# Patient Record
Sex: Female | Born: 1965 | Race: Black or African American | Hispanic: No | Marital: Single | Smoking: Never smoker
Health system: Southern US, Community
[De-identification: ages and names within clinical notes are randomized; demographics above are authoritative.]

## PROBLEM LIST (undated history)

## (undated) DIAGNOSIS — I10 Essential (primary) hypertension: Secondary | ICD-10-CM

## (undated) HISTORY — PX: TUBAL LIGATION: SHX77

## (undated) HISTORY — DX: Essential (primary) hypertension: I10

---

## 2003-12-04 ENCOUNTER — Emergency Department: Payer: Self-pay | Admitting: Emergency Medicine

## 2007-06-13 ENCOUNTER — Emergency Department: Payer: Self-pay | Admitting: Emergency Medicine

## 2017-09-04 ENCOUNTER — Ambulatory Visit (INDEPENDENT_AMBULATORY_CARE_PROVIDER_SITE_OTHER): Payer: BLUE CROSS/BLUE SHIELD | Admitting: Family Medicine

## 2017-09-04 ENCOUNTER — Encounter: Payer: Self-pay | Admitting: Family Medicine

## 2017-09-04 VITALS — BP 159/84 | HR 89 | Temp 98.5°F | Ht 64.5 in | Wt 195.8 lb

## 2017-09-04 DIAGNOSIS — Z7689 Persons encountering health services in other specified circumstances: Secondary | ICD-10-CM

## 2017-09-04 DIAGNOSIS — I10 Essential (primary) hypertension: Secondary | ICD-10-CM | POA: Diagnosis not present

## 2017-09-04 MED ORDER — AMLODIPINE BESYLATE 5 MG PO TABS: 5.0000 mg | ORAL_TABLET | Freq: Every day | ORAL | 0 refills | Status: DC

## 2017-09-04 NOTE — Progress Notes (Signed)
   BP (!) 159/84 (BP Location: Right Arm, Patient Position: Sitting, Cuff Size: Normal)   Pulse 89   Temp 98.5 F (36.9 C) (Oral)   Ht 5' 4.5" (1.638 m)   Wt 195 lb 12.8 oz (88.8 kg)   SpO2 97%   BMI 33.09 kg/m    Subjective:    Patient ID: Nicole Cortez, female    DOB: [birth year], 52 y.o.   MRN: [id number]  HPI: Nicole Cortez is a 52 y.o. female  Chief Complaint  Patient presents with  . Establish Care    No complaints   Here today to establish care. No concerns today. No known medical problems and not on any medications. Last CPE was 17 years ago when she had her son.   Has had some BP elevations in the past but never diagnosed with HTN. Does have a strong fhx of it. Denies CP, SOB, syncope, HAs, visual disturbances.  Relevant past medical, surgical, family and social history reviewed and updated as indicated. Interim medical history since our last visit reviewed. Allergies and medications reviewed and updated.  Review of Systems  Per HPI unless specifically indicated above     Objective:    BP (!) 159/84 (BP Location: Right Arm, Patient Position: Sitting, Cuff Size: Normal)   Pulse 89   Temp 98.5 F (36.9 C) (Oral)   Ht 5' 4.5" (1.638 m)   Wt 195 lb 12.8 oz (88.8 kg)   SpO2 97%   BMI 33.09 kg/m   Wt Readings from Last 3 Encounters:  09/04/17 195 lb 12.8 oz (88.8 kg)    Physical Exam  Constitutional: She is oriented to person, place, and time. She appears well-developed and well-nourished.  HENT:  Head: Atraumatic.  Eyes: Conjunctivae and EOM are normal.  Neck: Normal range of motion. Neck supple.  Cardiovascular: Normal rate and regular rhythm.  Pulmonary/Chest: Effort normal and breath sounds normal.  Musculoskeletal: Normal range of motion.  Neurological: She is alert and oriented to person, place, and time.  Skin: Skin is warm and dry.  Psychiatric: She has a normal mood and affect. Her behavior is normal.  Nursing note and vitals reviewed.   No  results found for this or any previous visit.    Assessment & Plan:   Problem List Items Addressed This Visit      Cardiovascular and Mediastinum   Essential hypertension - Primary    Start 5 mg amlodipine, DASH diet, increase exercise. Recheck in 1 month. Risks and benefits reviewed      Relevant Medications   amLODipine (NORVASC) 5 MG tablet    Other Visit Diagnoses    Encounter to establish care           Follow up plan: Return in about 4 weeks (around 10/02/2017) for CPE,BP f/u.

## 2017-09-07 NOTE — Patient Instructions (Signed)
Follow up for CPE 

## 2017-09-07 NOTE — Assessment & Plan Note (Signed)
Start 5 mg amlodipine, DASH diet, increase exercise. Recheck in 1 month. Risks and benefits reviewed

## 2017-12-19 ENCOUNTER — Telehealth: Payer: Self-pay | Admitting: Family Medicine

## 2017-12-19 ENCOUNTER — Other Ambulatory Visit: Payer: Self-pay | Admitting: Family Medicine

## 2017-12-19 ENCOUNTER — Encounter: Payer: Self-pay | Admitting: Family Medicine

## 2017-12-19 ENCOUNTER — Ambulatory Visit (INDEPENDENT_AMBULATORY_CARE_PROVIDER_SITE_OTHER): Payer: BLUE CROSS/BLUE SHIELD | Admitting: Family Medicine

## 2017-12-19 ENCOUNTER — Other Ambulatory Visit (HOSPITAL_COMMUNITY)
Admission: RE | Admit: 2017-12-19 | Discharge: 2017-12-19 | Disposition: A | Discharge status: Home / Self Care | Payer: BLUE CROSS/BLUE SHIELD | Source: Ambulatory Visit | Attending: Family Medicine | Admitting: Family Medicine

## 2017-12-19 VITALS — BP 170/108 | HR 81 | Temp 98.2°F | Ht 64.5 in | Wt 198.1 lb

## 2017-12-19 DIAGNOSIS — I10 Essential (primary) hypertension: Secondary | ICD-10-CM

## 2017-12-19 DIAGNOSIS — Z124 Encounter for screening for malignant neoplasm of cervix: Secondary | ICD-10-CM | POA: Insufficient documentation

## 2017-12-19 DIAGNOSIS — Z1211 Encounter for screening for malignant neoplasm of colon: Secondary | ICD-10-CM

## 2017-12-19 DIAGNOSIS — Z1239 Encounter for other screening for malignant neoplasm of breast: Secondary | ICD-10-CM

## 2017-12-19 DIAGNOSIS — Z0000 Encounter for general adult medical examination without abnormal findings: Secondary | ICD-10-CM

## 2017-12-19 DIAGNOSIS — B9689 Other specified bacterial agents as the cause of diseases classified elsewhere: Secondary | ICD-10-CM

## 2017-12-19 DIAGNOSIS — N760 Acute vaginitis: Secondary | ICD-10-CM

## 2017-12-19 DIAGNOSIS — N898 Other specified noninflammatory disorders of vagina: Secondary | ICD-10-CM | POA: Diagnosis not present

## 2017-12-19 LAB — UA/M W/RFLX CULTURE, ROUTINE
Bilirubin, UA: NEGATIVE
Glucose, UA: NEGATIVE
Ketones, UA: NEGATIVE
LEUKOCYTES UA: NEGATIVE
Nitrite, UA: NEGATIVE
Protein, UA: NEGATIVE
RBC UA: NEGATIVE
SPEC GRAV UA: 1.02 (ref 1.005–1.030)
Urobilinogen, Ur: 0.2 mg/dL (ref 0.2–1.0)
pH, UA: 6 (ref 5.0–7.5)

## 2017-12-19 LAB — WET PREP FOR TRICH, YEAST, CLUE
Clue Cell Exam: POSITIVE — AB
Trichomonas Exam: NEGATIVE
Yeast Exam: NEGATIVE

## 2017-12-19 MED ORDER — METRONIDAZOLE 500 MG PO TABS: 500.0000 mg | ORAL_TABLET | Freq: Two times a day (BID) | ORAL | 0 refills | Status: DC

## 2017-12-19 MED ORDER — AMLODIPINE BESYLATE 5 MG PO TABS: 5.0000 mg | ORAL_TABLET | Freq: Every day | ORAL | 0 refills | Status: DC

## 2017-12-19 NOTE — Telephone Encounter (Signed)
Nicole Cortez presented in office, wants to know if she can have prescription for amlodipine sent to pharmacy. Filled in august but wasn't ready to take it. Please advise

## 2017-12-19 NOTE — Telephone Encounter (Signed)
Sent!

## 2017-12-19 NOTE — Progress Notes (Signed)
BP (!) 170/108   Pulse 81   Temp 98.2 F (36.8 C) (Oral)   Ht 5' 4.5" (1.638 m)   Wt 198 lb 1.6 oz (89.9 kg)   LMP 11/16/2017 (Exact Date)   SpO2 98%   BMI 33.48 kg/m    Subjective:    Patient ID: Nicole Cortez, female    DOB: [birth year], 52 y.o.   MRN: [id number]  HPI: Nicole Cortez is a 52 y.o. female presenting on 12/19/2017 for comprehensive medical examination. Current medical complaints include:see below  Was given amlodipine at est care visit for persistently elevated BPs but did not start the medicine as her friends told her she shouldn't. States she is open to if she absolutely has to. Does not check her BPs outside of clinic. Denies CP, SOB, HAs, dizziness. Does not follow any particular diet or exercise regularly. Has never been on BP medicines before. Has a strong fhx of HTN.   Having vaginal discharge and mild lower abdominal cramping the past few weeks. No dysuria, hematuria, itching, concern for STIs.   She currently lives with: Menopausal Symptoms: no  Depression Screen done today and results listed below:  [facility] 12/19/2017  Decreased Interest 0  Down, Depressed, Hopeless 0  PHQ - 2 Score 0  Altered sleeping 0  Tired, decreased energy 1  Change in appetite 1  Feeling bad or failure about yourself  0  Trouble concentrating 0  Moving slowly or fidgety/restless 0  Suicidal thoughts 0  PHQ-9 Score 2  Difficult doing work/chores Not difficult at all    The patient does not have a history of falls. I did not complete a risk assessment for falls. A plan of care for falls was not documented.   Past Medical History:  History reviewed. No pertinent past medical history.  Surgical History:  Past Surgical History:  Procedure Laterality Date  . TUBAL LIGATION      Medications:  No current outpatient medications on file prior to visit.   No current facility-administered medications on file prior to visit.     Allergies:  No Known  Allergies  Social History:  Social History   Socioeconomic History  . Marital status: Single    Spouse name: Not on file  . Number of children: Not on file  . Years of education: Not on file  . Highest education level: Not on file  Occupational History  . Not on file  Social Needs  . Financial resource strain: Not on file  . Food insecurity:    Worry: Not on file    Inability: Not on file  . Transportation needs:    Medical: Not on file    Non-medical: Not on file  Tobacco Use  . Smoking status: Never Smoker  . Smokeless tobacco: Never Used  Substance and Sexual Activity  . Alcohol use: Yes    Alcohol/week: 3.0 standard drinks    Types: 3 Cans of beer per week    Comment: On Weekends  . Drug use: Never  . Sexual activity: Yes  Lifestyle  . Physical activity:    Days per week: Not on file    Minutes per session: Not on file  . Stress: Not on file  Relationships  . Social connections:    Talks on phone: Not on file    Gets together: Not on file    Attends religious service: Not on file    Active member of club or organization: Not on  file    Attends meetings of clubs or organizations: Not on file    Relationship status: Not on file  . Intimate partner violence:    Fear of current or ex partner: Not on file    Emotionally abused: Not on file    Physically abused: Not on file    Forced sexual activity: Not on file  Other Topics Concern  . Not on file  Social History Narrative  . Not on file   Social History   Tobacco Use  Smoking Status Never Smoker  Smokeless Tobacco Never Used   Social History   Substance and Sexual Activity  Alcohol Use Yes  . Alcohol/week: 3.0 standard drinks  . Types: 3 Cans of beer per week   Comment: On Weekends    Family History:  Family History  Problem Relation Age of Onset  . Hypertension Mother   . Kidney disease Mother   . Hypertension Maternal Aunt   . Kidney disease Maternal Aunt   . Hypertension Maternal Uncle     . Kidney disease Maternal Uncle     Past medical history, surgical history, medications, allergies, family history and social history reviewed with patient today and changes made to appropriate areas of the chart.   Review of Systems - General ROS: negative Psychological ROS: negative Ophthalmic ROS: negative ENT ROS: negative Allergy and Immunology ROS: negative Hematological and Lymphatic ROS: negative Endocrine ROS: negative Breast ROS: negative for breast lumps Respiratory ROS: no cough, shortness of breath, or wheezing Cardiovascular ROS: no chest pain or dyspnea on exertion Gastrointestinal ROS: no abdominal pain, change in bowel habits, or black or bloody stools Genito-Urinary ROS: positive for - genital discharge Musculoskeletal ROS: negative Neurological ROS: no TIA or stroke symptoms Dermatological ROS: negative All other ROS negative except what is listed above and in the HPI.      Objective:    BP (!) 170/108   Pulse 81   Temp 98.2 F (36.8 C) (Oral)   Ht 5' 4.5" (1.638 m)   Wt 198 lb 1.6 oz (89.9 kg)   LMP 11/16/2017 (Exact Date)   SpO2 98%   BMI 33.48 kg/m   Wt Readings from Last 3 Encounters:  12/19/17 198 lb 1.6 oz (89.9 kg)  09/04/17 195 lb 12.8 oz (88.8 kg)    Physical Exam  Constitutional: She is oriented to person, place, and time. She appears well-developed and well-nourished. No distress.  HENT:  Head: Atraumatic.  Right Ear: External ear normal.  Left Ear: External ear normal.  Nose: Nose normal.  Mouth/Throat: Oropharynx is clear and moist. No oropharyngeal exudate.  Eyes: Pupils are equal, round, and reactive to light. Conjunctivae are normal. No scleral icterus.  Neck: Normal range of motion. Neck supple. No thyromegaly present.  Cardiovascular: Normal rate, regular rhythm, normal heart sounds and intact distal pulses.  Pulmonary/Chest: Effort normal and breath sounds normal. No respiratory distress. Right breast exhibits no mass, no  nipple discharge, no skin change and no tenderness. Left breast exhibits no mass, no nipple discharge, no skin change and no tenderness.  Abdominal: Soft. Bowel sounds are normal. She exhibits no mass. There is no tenderness.  Musculoskeletal: Normal range of motion. She exhibits no edema or tenderness.  Lymphadenopathy:    She has no cervical adenopathy.    She has no axillary adenopathy.  Neurological: She is alert and oriented to person, place, and time. No cranial nerve deficit.  Skin: Skin is warm and dry. No rash noted.  Psychiatric: She has a normal mood and affect. Her behavior is normal.  Nursing note and vitals reviewed.   Results for orders placed or performed in visit on 12/19/17  WET PREP FOR TRICH, YEAST, CLUE  Result Value Ref Range   Trichomonas Exam Negative Negative   Yeast Exam Negative Negative   Clue Cell Exam Positive (A) Negative  CBC with Differential/Platelet  Result Value Ref Range   WBC 5.1 3.4 - 10.8 x10E3/uL   RBC 3.66 (L) 3.77 - 5.28 x10E6/uL   Hemoglobin 10.1 (L) 11.1 - 15.9 g/dL   Hematocrit 16.1 (L) 09.6 - 46.6 %   MCV 85 79 - 97 fL   MCH 27.6 26.6 - 33.0 pg   MCHC 32.5 31.5 - 35.7 g/dL   RDW 04.5 (H) 40.9 - 81.1 %   Platelets 341 150 - 450 x10E3/uL   Neutrophils 42 Not Estab. %   Lymphs 40 Not Estab. %   Monocytes 13 Not Estab. %   Eos 4 Not Estab. %   Basos 1 Not Estab. %   Neutrophils Absolute 2.1 1.4 - 7.0 x10E3/uL   Lymphocytes Absolute 2.1 0.7 - 3.1 x10E3/uL   Monocytes Absolute 0.7 0.1 - 0.9 x10E3/uL   EOS (ABSOLUTE) 0.2 0.0 - 0.4 x10E3/uL   Basophils Absolute 0.0 0.0 - 0.2 x10E3/uL   Immature Granulocytes 0 Not Estab. %   Immature Grans (Abs) 0.0 0.0 - 0.1 x10E3/uL  Comprehensive metabolic panel  Result Value Ref Range   Glucose 95 65 - 99 mg/dL   BUN 15 6 - 24 mg/dL   Creatinine, Ser 9.14 (H) 0.57 - 1.00 mg/dL   GFR calc non Af Amer 63 >59 mL/min/1.73   GFR calc Af Amer 73 >59 mL/min/1.73   BUN/Creatinine Ratio 15 9 - 23    Sodium 136 134 - 144 mmol/L   Potassium 4.3 3.5 - 5.2 mmol/L   Chloride 101 96 - 106 mmol/L   CO2 22 20 - 29 mmol/L   Calcium 9.5 8.7 - 10.2 mg/dL   Total Protein 7.3 6.0 - 8.5 g/dL   Albumin 4.7 3.5 - 5.5 g/dL   Globulin, Total 2.6 1.5 - 4.5 g/dL   Albumin/Globulin Ratio 1.8 1.2 - 2.2   Bilirubin Total 0.2 0.0 - 1.2 mg/dL   Alkaline Phosphatase 38 (L) 39 - 117 IU/L   AST 19 0 - 40 IU/L   ALT 16 0 - 32 IU/L  Lipid Panel w/o Chol/HDL Ratio  Result Value Ref Range   Cholesterol, Total 227 (H) 100 - 199 mg/dL   Triglycerides 96 0 - 149 mg/dL   HDL 84 >78 mg/dL   VLDL Cholesterol Cal 19 5 - 40 mg/dL   LDL Calculated 295 (H) 0 - 99 mg/dL  TSH  Result Value Ref Range   TSH 3.240 0.450 - 4.500 uIU/mL  UA/M w/rflx Culture, Routine  Result Value Ref Range   Specific Gravity, UA 1.020 1.005 - 1.030   pH, UA 6.0 5.0 - 7.5   Color, UA Yellow Yellow   Appearance Ur Clear Clear   Leukocytes, UA Negative Negative   Protein, UA Negative Negative/Trace   Glucose, UA Negative Negative   Ketones, UA Negative Negative   RBC, UA Negative Negative   Bilirubin, UA Negative Negative   Urobilinogen, Ur 0.2 0.2 - 1.0 mg/dL   Nitrite, UA Negative Negative  Cytology - PAP  Result Value Ref Range   Adequacy      Satisfactory for evaluation  endocervical/transformation zone  component PRESENT.   Diagnosis      NEGATIVE FOR INTRAEPITHELIAL LESIONS OR MALIGNANCY.   HPV NOT DETECTED    Material Submitted CervicoVaginal Pap [ThinPrep Imaged]    CYTOLOGY - PAP PAP RESULT       Assessment & Plan:   Problem List Items Addressed This Visit      Cardiovascular and Mediastinum   Essential hypertension - Primary    BPs continue to be elevated. Recommended she start the amlodipine and follow up in 1 month for recheck. Script for home monitor given, requested she keep a log of readings. DASH diet, exercise       Other Visit Diagnoses    Annual physical exam       Relevant Orders   CBC with  Differential/Platelet (Completed)   Comprehensive metabolic panel (Completed)   Lipid Panel w/o Chol/HDL Ratio (Completed)   TSH (Completed)   UA/M w/rflx Culture, Routine (Completed)   BV (bacterial vaginosis)       Wet prep + for BV. Tx with flagyl, probiotics. Vaginal hygiene reviewed   Relevant Orders   WET PREP FOR TRICH, YEAST, CLUE (Completed)   Screening for breast cancer       Relevant Orders   MM DIGITAL SCREENING BILATERAL   Screening for colon cancer       Relevant Orders   Cologuard   Screening for cervical cancer       Relevant Orders   Cytology - PAP (Completed)       Follow up plan: Return in about 4 weeks (around 01/16/2018) for BP f/u.   LABORATORY TESTING:  - Pap smear: pap done  IMMUNIZATIONS:   - Tdap: Tetanus vaccination status reviewed: last tetanus booster within 10 years. - Influenza: Refused  SCREENING: -Mammogram: Ordered today  - Colonoscopy: Ordered today   PATIENT COUNSELING:   Advised to take 1 mg of folate supplement per day if capable of pregnancy.   Sexuality: Discussed sexually transmitted diseases, partner selection, use of condoms, avoidance of unintended pregnancy  and contraceptive alternatives.   Advised to avoid cigarette smoking.  I discussed with the patient that most people either abstain from alcohol or drink within safe limits (<=14/week and <=4 drinks/occasion for males, <=7/weeks and <= 3 drinks/occasion for females) and that the risk for alcohol disorders and other health effects rises proportionally with the number of drinks per week and how often a drinker exceeds daily limits.  Discussed cessation/primary prevention of drug use and availability of treatment for abuse.   Diet: Encouraged to adjust caloric intake to maintain  or achieve ideal body weight, to reduce intake of dietary saturated fat and total fat, to limit sodium intake by avoiding high sodium foods and not adding table salt, and to maintain adequate dietary  potassium and calcium preferably from fresh fruits, vegetables, and low-fat dairy products.    stressed the importance of regular exercise  Injury prevention: Discussed safety belts, safety helmets, smoke detector, smoking near bedding or upholstery.   Dental health: Discussed importance of regular tooth brushing, flossing, and dental visits.    NEXT PREVENTATIVE PHYSICAL DUE IN 1 YEAR. Return in about 4 weeks (around 01/16/2018) for BP f/u.

## 2017-12-19 NOTE — Telephone Encounter (Signed)
Copied from CRM [phone]. Topic: Quick Communication - See Telephone Encounter >> Dec 19, 2017  4:58 PM Jens SomMedley, Jennifer A wrote: CRM for notification. See Telephone encounter for: 12/19/17.  Patient is calling to understand  why the prescribtion for metroNIDAZOLE (FLAGYL) 500 MG tablet [[id number]][[id number]] was called in for her. Please advise

## 2017-12-20 LAB — COMPREHENSIVE METABOLIC PANEL
ALBUMIN: 4.7 g/dL (ref 3.5–5.5)
ALK PHOS: 38 IU/L — AB (ref 39–117)
ALT: 16 IU/L (ref 0–32)
AST: 19 IU/L (ref 0–40)
Albumin/Globulin Ratio: 1.8 (ref 1.2–2.2)
BUN / CREAT RATIO: 15 (ref 9–23)
BUN: 15 mg/dL (ref 6–24)
Bilirubin Total: 0.2 mg/dL (ref 0.0–1.2)
CO2: 22 mmol/L (ref 20–29)
CREATININE: 1.01 mg/dL — AB (ref 0.57–1.00)
Calcium: 9.5 mg/dL (ref 8.7–10.2)
Chloride: 101 mmol/L (ref 96–106)
GFR calc Af Amer: 73 mL/min/{1.73_m2} (ref >59)
GFR calc non Af Amer: 63 mL/min/{1.73_m2} (ref >59)
GLOBULIN, TOTAL: 2.6 g/dL (ref 1.5–4.5)
Glucose: 95 mg/dL (ref 65–99)
Potassium: 4.3 mmol/L (ref 3.5–5.2)
SODIUM: 136 mmol/L (ref 134–144)
Total Protein: 7.3 g/dL (ref 6.0–8.5)

## 2017-12-20 LAB — CBC WITH DIFFERENTIAL/PLATELET
BASOS: 1 %
Basophils Absolute: 0 10*3/uL (ref 0.0–0.2)
EOS (ABSOLUTE): 0.2 10*3/uL (ref 0.0–0.4)
Eos: 4 %
HEMATOCRIT: 31.1 % — AB (ref 34.0–46.6)
HEMOGLOBIN: 10.1 g/dL — AB (ref 11.1–15.9)
Immature Grans (Abs): 0 10*3/uL (ref 0.0–0.1)
Immature Granulocytes: 0 %
LYMPHS ABS: 2.1 10*3/uL (ref 0.7–3.1)
Lymphs: 40 %
MCH: 27.6 pg (ref 26.6–33.0)
MCHC: 32.5 g/dL (ref 31.5–35.7)
MCV: 85 fL (ref 79–97)
MONOCYTES: 13 %
Monocytes Absolute: 0.7 10*3/uL (ref 0.1–0.9)
Neutrophils Absolute: 2.1 10*3/uL (ref 1.4–7.0)
Neutrophils: 42 %
Platelets: 341 10*3/uL (ref 150–450)
RBC: 3.66 x10E6/uL — ABNORMAL LOW (ref 3.77–5.28)
RDW: 15.5 % — ABNORMAL HIGH (ref 12.3–15.4)
WBC: 5.1 10*3/uL (ref 3.4–10.8)

## 2017-12-20 LAB — LIPID PANEL W/O CHOL/HDL RATIO
CHOLESTEROL TOTAL: 227 mg/dL — AB (ref 100–199)
HDL: 84 mg/dL (ref >39)
LDL Calculated: 124 mg/dL — ABNORMAL HIGH (ref 0–99)
TRIGLYCERIDES: 96 mg/dL (ref 0–149)
VLDL CHOLESTEROL CAL: 19 mg/dL (ref 5–40)

## 2017-12-20 LAB — TSH: TSH: 3.24 u[IU]/mL (ref 0.450–4.500)

## 2017-12-22 ENCOUNTER — Telehealth: Payer: Self-pay | Admitting: Family Medicine

## 2017-12-22 LAB — CYTOLOGY - PAP
DIAGNOSIS: NEGATIVE
HPV (WINDOPATH): NOT DETECTED

## 2017-12-22 NOTE — Telephone Encounter (Signed)
Patient was notified that the medicine was being called in for BV on Friday - please call and make sure she's aware of this

## 2017-12-22 NOTE — Telephone Encounter (Signed)
Patient notified

## 2017-12-22 NOTE — Telephone Encounter (Signed)
Result note sent from lab results tab

## 2017-12-22 NOTE — Telephone Encounter (Signed)
Routing to provider  

## 2017-12-22 NOTE — Telephone Encounter (Signed)
Routing to provider for results.  

## 2017-12-22 NOTE — Telephone Encounter (Signed)
Copied from CRM [phone]. Topic: Quick Communication - See Telephone Encounter >> Dec 22, 2017  2:11 PM Terisa Starraylor, Brittany L wrote: CRM for notification. See Telephone encounter for: 12/22/17.  Patient is calling for her lab results. Please contact patient.

## 2017-12-23 NOTE — Assessment & Plan Note (Signed)
BPs continue to be elevated. Recommended she start the amlodipine and follow up in 1 month for recheck. Script for home monitor given, requested she keep a log of readings. DASH diet, exercise

## 2018-01-16 ENCOUNTER — Ambulatory Visit (INDEPENDENT_AMBULATORY_CARE_PROVIDER_SITE_OTHER): Payer: BLUE CROSS/BLUE SHIELD | Admitting: Family Medicine

## 2018-01-16 ENCOUNTER — Encounter: Payer: Self-pay | Admitting: Family Medicine

## 2018-01-16 VITALS — BP 147/92 | HR 71 | Temp 98.3°F | Wt 193.6 lb

## 2018-01-16 DIAGNOSIS — I10 Essential (primary) hypertension: Secondary | ICD-10-CM

## 2018-01-16 MED ORDER — AMLODIPINE BESYLATE 10 MG PO TABS: 10.0000 mg | ORAL_TABLET | Freq: Every day | ORAL | 0 refills | Status: DC

## 2018-01-16 NOTE — Progress Notes (Signed)
BP (!) 147/92 (BP Location: Left Arm, Cuff Size: Normal)   Pulse 71   Temp 98.3 F (36.8 C) (Oral)   Wt 193 lb 9.6 oz (87.8 kg)   SpO2 98%   BMI 32.72 kg/m    Subjective:    Patient ID: Nicole Cortez, female    DOB: [birth year], 53 y.o.   MRN: [id number]  HPI: Nicole Cortez is a 53 y.o. female  Chief Complaint  Patient presents with  . Hypertension   Here today for 1 month HTN follow up. Started on 5 mg amlodipine which she has been taking faithfully without side effects. Started checking home BPs which have been 140s/90s. Has cut out sodas and lost 5-10 lb. Trying to exercise as much as able. Denies CP, SOB, HAs, dizziness, syncope.   Relevant past medical, surgical, family and social history reviewed and updated as indicated. Interim medical history since our last visit reviewed. Allergies and medications reviewed and updated.  Review of Systems  Per HPI unless specifically indicated above     Objective:    BP (!) 147/92 (BP Location: Left Arm, Cuff Size: Normal)   Pulse 71   Temp 98.3 F (36.8 C) (Oral)   Wt 193 lb 9.6 oz (87.8 kg)   SpO2 98%   BMI 32.72 kg/m   Wt Readings from Last 3 Encounters:  01/16/18 193 lb 9.6 oz (87.8 kg)  12/19/17 198 lb 1.6 oz (89.9 kg)  09/04/17 195 lb 12.8 oz (88.8 kg)    Physical Exam Vitals signs and nursing note reviewed.  Constitutional:      Appearance: Normal appearance. She is not ill-appearing.  HENT:     Head: Atraumatic.  Eyes:     Extraocular Movements: Extraocular movements intact.     Conjunctiva/sclera: Conjunctivae normal.  Neck:     Musculoskeletal: Normal range of motion and neck supple.  Cardiovascular:     Rate and Rhythm: Normal rate and regular rhythm.     Heart sounds: Normal heart sounds.  Pulmonary:     Effort: Pulmonary effort is normal.     Breath sounds: Normal breath sounds.  Musculoskeletal: Normal range of motion.  Skin:    General: Skin is warm and dry.  Neurological:     Mental Status:  She is alert and oriented to person, place, and time.  Psychiatric:        Mood and Affect: Mood normal.        Thought Content: Thought content normal.        Judgment: Judgment normal.     Results for orders placed or performed in visit on 12/19/17  WET PREP FOR TRICH, YEAST, CLUE  Result Value Ref Range   Trichomonas Exam Negative Negative   Yeast Exam Negative Negative   Clue Cell Exam Positive (A) Negative  CBC with Differential/Platelet  Result Value Ref Range   WBC 5.1 3.4 - 10.8 x10E3/uL   RBC 3.66 (L) 3.77 - 5.28 x10E6/uL   Hemoglobin 10.1 (L) 11.1 - 15.9 g/dL   Hematocrit 41.6 (L) 60.6 - 46.6 %   MCV 85 79 - 97 fL   MCH 27.6 26.6 - 33.0 pg   MCHC 32.5 31.5 - 35.7 g/dL   RDW 30.1 (H) 60.1 - 09.3 %   Platelets 341 150 - 450 x10E3/uL   Neutrophils 42 Not Estab. %   Lymphs 40 Not Estab. %   Monocytes 13 Not Estab. %   Eos 4 Not Estab. %   Basos  1 Not Estab. %   Neutrophils Absolute 2.1 1.4 - 7.0 x10E3/uL   Lymphocytes Absolute 2.1 0.7 - 3.1 x10E3/uL   Monocytes Absolute 0.7 0.1 - 0.9 x10E3/uL   EOS (ABSOLUTE) 0.2 0.0 - 0.4 x10E3/uL   Basophils Absolute 0.0 0.0 - 0.2 x10E3/uL   Immature Granulocytes 0 Not Estab. %   Immature Grans (Abs) 0.0 0.0 - 0.1 x10E3/uL  Comprehensive metabolic panel  Result Value Ref Range   Glucose 95 65 - 99 mg/dL   BUN 15 6 - 24 mg/dL   Creatinine, Ser 2.22 (H) 0.57 - 1.00 mg/dL   GFR calc non Af Amer 63 >59 mL/min/1.73   GFR calc Af Amer 73 >59 mL/min/1.73   BUN/Creatinine Ratio 15 9 - 23   Sodium 136 134 - 144 mmol/L   Potassium 4.3 3.5 - 5.2 mmol/L   Chloride 101 96 - 106 mmol/L   CO2 22 20 - 29 mmol/L   Calcium 9.5 8.7 - 10.2 mg/dL   Total Protein 7.3 6.0 - 8.5 g/dL   Albumin 4.7 3.5 - 5.5 g/dL   Globulin, Total 2.6 1.5 - 4.5 g/dL   Albumin/Globulin Ratio 1.8 1.2 - 2.2   Bilirubin Total 0.2 0.0 - 1.2 mg/dL   Alkaline Phosphatase 38 (L) 39 - 117 IU/L   AST 19 0 - 40 IU/L   ALT 16 0 - 32 IU/L  Lipid Panel w/o Chol/HDL Ratio    Result Value Ref Range   Cholesterol, Total 227 (H) 100 - 199 mg/dL   Triglycerides 96 0 - 149 mg/dL   HDL 84 >97 mg/dL   VLDL Cholesterol Cal 19 5 - 40 mg/dL   LDL Calculated 989 (H) 0 - 99 mg/dL  TSH  Result Value Ref Range   TSH 3.240 0.450 - 4.500 uIU/mL  UA/M w/rflx Culture, Routine  Result Value Ref Range   Specific Gravity, UA 1.020 1.005 - 1.030   pH, UA 6.0 5.0 - 7.5   Color, UA Yellow Yellow   Appearance Ur Clear Clear   Leukocytes, UA Negative Negative   Protein, UA Negative Negative/Trace   Glucose, UA Negative Negative   Ketones, UA Negative Negative   RBC, UA Negative Negative   Bilirubin, UA Negative Negative   Urobilinogen, Ur 0.2 0.2 - 1.0 mg/dL   Nitrite, UA Negative Negative  Cytology - PAP  Result Value Ref Range   Adequacy      Satisfactory for evaluation  endocervical/transformation zone component PRESENT.   Diagnosis      NEGATIVE FOR INTRAEPITHELIAL LESIONS OR MALIGNANCY.   HPV NOT DETECTED    Material Submitted CervicoVaginal Pap [ThinPrep Imaged]    CYTOLOGY - PAP PAP RESULT       Assessment & Plan:   Problem List Items Addressed This Visit      Cardiovascular and Mediastinum   Essential hypertension - Primary    Increase to 10 mg amlodipine and continue good lifestyle modifications. Follow up in 1 month. Continue logging home readings.        Relevant Medications   amLODipine (NORVASC) 10 MG tablet       Follow up plan: Return in about 4 weeks (around 02/13/2018) for BP.

## 2018-01-16 NOTE — Patient Instructions (Signed)
[facility] - [phone]    

## 2018-01-17 NOTE — Assessment & Plan Note (Signed)
Increase to 10 mg amlodipine and continue good lifestyle modifications. Follow up in 1 month. Continue logging home readings.

## 2018-01-28 ENCOUNTER — Telehealth: Payer: Self-pay

## 2018-01-28 NOTE — Telephone Encounter (Signed)
Letter mailed to patient.

## 2018-02-11 ENCOUNTER — Ambulatory Visit (INDEPENDENT_AMBULATORY_CARE_PROVIDER_SITE_OTHER): Payer: BLUE CROSS/BLUE SHIELD | Admitting: Family Medicine

## 2018-02-11 ENCOUNTER — Encounter: Payer: Self-pay | Admitting: Family Medicine

## 2018-02-11 VITALS — BP 148/82 | HR 58 | Temp 98.0°F | Ht 64.5 in | Wt 194.0 lb

## 2018-02-11 DIAGNOSIS — I10 Essential (primary) hypertension: Secondary | ICD-10-CM

## 2018-02-11 MED ORDER — AMLODIPINE BESY-BENAZEPRIL HCL 10-20 MG PO CAPS: 1.0000 | ORAL_CAPSULE | Freq: Every day | ORAL | 0 refills | Status: DC

## 2018-02-11 NOTE — Progress Notes (Signed)
BP (!) 148/82   Pulse (!) 58   Temp 98 F (36.7 C) (Oral)   Ht 5' 4.5" (1.638 m)   Wt 194 lb (88 kg)   SpO2 97%   BMI 32.79 kg/m    Subjective:    Patient ID: Nicole Cortez, female    DOB: 10-23-1965, 53 y.o.   MRN: [id number]  HPI: Nicole Cortez is a 53 y.o. female  Chief Complaint  Patient presents with  . Follow-up  . Hypertension   Here today for HTN follow up after increasing amlodipine to 10 mg. Taking faithfully without side effects but not noticing much difference with her readings. Checking at work as she's able and getting 150s/90s pretty consistently. Denies CP, SOB, HAs, dizziness, syncope. Has been working hard on diet modifications and has not had a soda in almost 2 months. Down 5 lb in just over a month.   Relevant past medical, surgical, family and social history reviewed and updated as indicated. Interim medical history since our last visit reviewed. Allergies and medications reviewed and updated.  Review of Systems  Per HPI unless specifically indicated above     Objective:    BP (!) 148/82   Pulse (!) 58   Temp 98 F (36.7 C) (Oral)   Ht 5' 4.5" (1.638 m)   Wt 194 lb (88 kg)   SpO2 97%   BMI 32.79 kg/m   Wt Readings from Last 3 Encounters:  02/11/18 194 lb (88 kg)  01/16/18 193 lb 9.6 oz (87.8 kg)  12/19/17 198 lb 1.6 oz (89.9 kg)    Physical Exam Vitals signs and nursing note reviewed.  Constitutional:      Appearance: Normal appearance. She is not ill-appearing.  HENT:     Head: Atraumatic.  Eyes:     Extraocular Movements: Extraocular movements intact.     Conjunctiva/sclera: Conjunctivae normal.  Neck:     Musculoskeletal: Normal range of motion and neck supple.  Cardiovascular:     Rate and Rhythm: Normal rate and regular rhythm.     Heart sounds: Normal heart sounds.  Pulmonary:     Effort: Pulmonary effort is normal.     Breath sounds: Normal breath sounds.  Musculoskeletal: Normal range of motion.  Skin:    General: Skin  is warm and dry.  Neurological:     Mental Status: She is alert and oriented to person, place, and time.  Psychiatric:        Mood and Affect: Mood normal.        Thought Content: Thought content normal.        Judgment: Judgment normal.     Results for orders placed or performed in visit on 12/19/17  WET PREP FOR TRICH, YEAST, CLUE  Result Value Ref Range   Trichomonas Exam Negative Negative   Yeast Exam Negative Negative   Clue Cell Exam Positive (A) Negative  CBC with Differential/Platelet  Result Value Ref Range   WBC 5.1 3.4 - 10.8 x10E3/uL   RBC 3.66 (L) 3.77 - 5.28 x10E6/uL   Hemoglobin 10.1 (L) 11.1 - 15.9 g/dL   Hematocrit 32.4 (L) 40.1 - 46.6 %   MCV 85 79 - 97 fL   MCH 27.6 26.6 - 33.0 pg   MCHC 32.5 31.5 - 35.7 g/dL   RDW 02.7 (H) 25.3 - 66.4 %   Platelets 341 150 - 450 x10E3/uL   Neutrophils 42 Not Estab. %   Lymphs 40 Not Estab. %  Monocytes 13 Not Estab. %   Eos 4 Not Estab. %   Basos 1 Not Estab. %   Neutrophils Absolute 2.1 1.4 - 7.0 x10E3/uL   Lymphocytes Absolute 2.1 0.7 - 3.1 x10E3/uL   Monocytes Absolute 0.7 0.1 - 0.9 x10E3/uL   EOS (ABSOLUTE) 0.2 0.0 - 0.4 x10E3/uL   Basophils Absolute 0.0 0.0 - 0.2 x10E3/uL   Immature Granulocytes 0 Not Estab. %   Immature Grans (Abs) 0.0 0.0 - 0.1 x10E3/uL  Comprehensive metabolic panel  Result Value Ref Range   Glucose 95 65 - 99 mg/dL   BUN 15 6 - 24 mg/dL   Creatinine, Ser 0.981.01 (H) 0.57 - 1.00 mg/dL   GFR calc non Af Amer 63 >59 mL/min/1.73   GFR calc Af Amer 73 >59 mL/min/1.73   BUN/Creatinine Ratio 15 9 - 23   Sodium 136 134 - 144 mmol/L   Potassium 4.3 3.5 - 5.2 mmol/L   Chloride 101 96 - 106 mmol/L   CO2 22 20 - 29 mmol/L   Calcium 9.5 8.7 - 10.2 mg/dL   Total Protein 7.3 6.0 - 8.5 g/dL   Albumin 4.7 3.5 - 5.5 g/dL   Globulin, Total 2.6 1.5 - 4.5 g/dL   Albumin/Globulin Ratio 1.8 1.2 - 2.2   Bilirubin Total 0.2 0.0 - 1.2 mg/dL   Alkaline Phosphatase 38 (L) 39 - 117 IU/L   AST 19 0 - 40 IU/L    ALT 16 0 - 32 IU/L  Lipid Panel w/o Chol/HDL Ratio  Result Value Ref Range   Cholesterol, Total 227 (H) 100 - 199 mg/dL   Triglycerides 96 0 - 149 mg/dL   HDL 84 >11>39 mg/dL   VLDL Cholesterol Cal 19 5 - 40 mg/dL   LDL Calculated 914124 (H) 0 - 99 mg/dL  TSH  Result Value Ref Range   TSH 3.240 0.450 - 4.500 uIU/mL  UA/M w/rflx Culture, Routine  Result Value Ref Range   Specific Gravity, UA 1.020 1.005 - 1.030   pH, UA 6.0 5.0 - 7.5   Color, UA Yellow Yellow   Appearance Ur Clear Clear   Leukocytes, UA Negative Negative   Protein, UA Negative Negative/Trace   Glucose, UA Negative Negative   Ketones, UA Negative Negative   RBC, UA Negative Negative   Bilirubin, UA Negative Negative   Urobilinogen, Ur 0.2 0.2 - 1.0 mg/dL   Nitrite, UA Negative Negative  Cytology - PAP  Result Value Ref Range   Adequacy      Satisfactory for evaluation  endocervical/transformation zone component PRESENT.   Diagnosis      NEGATIVE FOR INTRAEPITHELIAL LESIONS OR MALIGNANCY.   HPV NOT DETECTED    Material Submitted CervicoVaginal Pap [ThinPrep Imaged]    CYTOLOGY - PAP PAP RESULT       Assessment & Plan:   Problem List Items Addressed This Visit      Cardiovascular and Mediastinum   Essential hypertension - Primary    Start amlodipine benazepril to help achieve better BP control. Continue monitoring at work, call with persistent abnormal readings or side effects      Relevant Medications   amLODipine-benazepril (LOTREL) 10-20 MG capsule       Follow up plan: Return in about 4 weeks (around 03/11/2018) for BP.

## 2018-02-13 NOTE — Assessment & Plan Note (Signed)
Start amlodipine benazepril to help achieve better BP control. Continue monitoring at work, call with persistent abnormal readings or side effects

## 2018-03-12 ENCOUNTER — Other Ambulatory Visit: Payer: Self-pay | Admitting: Family Medicine

## 2018-03-12 NOTE — Telephone Encounter (Signed)
Requested Prescriptions  Pending Prescriptions Disp Refills  . amLODipine (NORVASC) 5 MG tablet [Pharmacy Med Name: AMLODIPINE BESYLATE 5 MG TAB] 90 tablet 0    Sig: TAKE 1 TABLET BY MOUTH EVERY DAY     Cardiovascular:  Calcium Channel Blockers Failed - 03/12/2018  1:23 AM      Failed - Last BP in normal range    BP Readings from Last 1 Encounters:  02/11/18 (!) 148/82         Passed - Valid encounter within last 6 months    Recent Outpatient Visits          4 weeks ago Essential hypertension   [facility] [place], Salley Hews, [place]   1 month ago Essential hypertension   [facility] [place], [place]   2 months ago Essential hypertension   [facility] [place], [place]   6 months ago Essential hypertension   [facility] [place], Salley Hews, [place]      Future Appointments            Tomorrow Maurice March, [place]ws, PA-C [facility], [facility]

## 2018-03-13 ENCOUNTER — Encounter: Payer: Self-pay | Admitting: Family Medicine

## 2018-03-13 ENCOUNTER — Ambulatory Visit (INDEPENDENT_AMBULATORY_CARE_PROVIDER_SITE_OTHER): Payer: BLUE CROSS/BLUE SHIELD | Admitting: Family Medicine

## 2018-03-13 VITALS — BP 134/82 | HR 91 | Temp 98.1°F | Wt 193.8 lb

## 2018-03-13 DIAGNOSIS — I10 Essential (primary) hypertension: Secondary | ICD-10-CM | POA: Diagnosis not present

## 2018-03-13 MED ORDER — AMLODIPINE BESY-BENAZEPRIL HCL 10-20 MG PO CAPS: 1.0000 | ORAL_CAPSULE | Freq: Every day | ORAL | 1 refills | Status: DC

## 2018-03-13 NOTE — Assessment & Plan Note (Signed)
Stable and WNL, continue current regimen 

## 2018-03-13 NOTE — Progress Notes (Signed)
BP 134/82 (BP Location: Left Arm, Cuff Size: Normal)   Pulse 91   Temp 98.1 F (36.7 C) (Oral)   Wt 193 lb 12.8 oz (87.9 kg)   SpO2 96%   BMI 32.75 kg/m    Subjective:    Patient ID: Nicole Cortez, female    DOB: 02-13-1965, 53 y.o.   MRN: [id number]  HPI: Nicole Cortez is a 53 y.o. female  Chief Complaint  Patient presents with  . Hypertension    4 week f/up   Here today for 1 month BP f/u. Currently on amlodipine - benazepril, taking daily without side effects. Denies CP, SOB, HAs, dizziness. Checking BPs at work occasionally and getting 120s-130s/80s. Has continued doing very well with diet and increasing activity.   Relevant past medical, surgical, family and social history reviewed and updated as indicated. Interim medical history since our last visit reviewed. Allergies and medications reviewed and updated.  Review of Systems  Per HPI unless specifically indicated above     Objective:    BP 134/82 (BP Location: Left Arm, Cuff Size: Normal)   Pulse 91   Temp 98.1 F (36.7 C) (Oral)   Wt 193 lb 12.8 oz (87.9 kg)   SpO2 96%   BMI 32.75 kg/m   Wt Readings from Last 3 Encounters:  03/13/18 193 lb 12.8 oz (87.9 kg)  02/11/18 194 lb (88 kg)  01/16/18 193 lb 9.6 oz (87.8 kg)    Physical Exam Vitals signs and nursing note reviewed.  Constitutional:      Appearance: Normal appearance. She is not ill-appearing.  HENT:     Head: Atraumatic.  Eyes:     Extraocular Movements: Extraocular movements intact.     Conjunctiva/sclera: Conjunctivae normal.  Neck:     Musculoskeletal: Normal range of motion and neck supple.  Cardiovascular:     Rate and Rhythm: Normal rate and regular rhythm.     Heart sounds: Normal heart sounds.  Pulmonary:     Effort: Pulmonary effort is normal.     Breath sounds: Normal breath sounds.  Musculoskeletal: Normal range of motion.  Skin:    General: Skin is warm and dry.  Neurological:     Mental Status: She is alert and oriented  to person, place, and time.  Psychiatric:        Mood and Affect: Mood normal.        Thought Content: Thought content normal.        Judgment: Judgment normal.     Results for orders placed or performed in visit on 03/13/18  Basic Metabolic Panel (BMET)  Result Value Ref Range   Glucose 96 65 - 99 mg/dL   BUN 17 6 - 24 mg/dL   Creatinine, Ser 0.35 (H) 0.57 - 1.00 mg/dL   GFR calc non Af Amer 60 >59 mL/min/1.73   GFR calc Af Amer 69 >59 mL/min/1.73   BUN/Creatinine Ratio 16 9 - 23   Sodium 142 134 - 144 mmol/L   Potassium 4.3 3.5 - 5.2 mmol/L   Chloride 106 96 - 106 mmol/L   CO2 23 20 - 29 mmol/L   Calcium 9.4 8.7 - 10.2 mg/dL      Assessment & Plan:   Problem List Items Addressed This Visit      Cardiovascular and Mediastinum   Essential hypertension - Primary    Stable and WNL, continue current regimen      Relevant Medications   amLODipine-benazepril (LOTREL) 10-20 MG capsule  Other Relevant Orders   Basic Metabolic Panel (BMET) (Completed)       Follow up plan: Return in about 6 months (around 09/11/2018) for HTN.

## 2018-03-14 LAB — BASIC METABOLIC PANEL
BUN/Creatinine Ratio: 16 (ref 9–23)
BUN: 17 mg/dL (ref 6–24)
CO2: 23 mmol/L (ref 20–29)
CREATININE: 1.06 mg/dL — AB (ref 0.57–1.00)
Calcium: 9.4 mg/dL (ref 8.7–10.2)
Chloride: 106 mmol/L (ref 96–106)
GFR calc Af Amer: 69 mL/min/{1.73_m2} (ref >59)
GFR, EST NON AFRICAN AMERICAN: 60 mL/min/{1.73_m2} (ref >59)
Glucose: 96 mg/dL (ref 65–99)
Potassium: 4.3 mmol/L (ref 3.5–5.2)
Sodium: 142 mmol/L (ref 134–144)

## 2018-03-16 ENCOUNTER — Encounter: Payer: Self-pay | Admitting: Family Medicine

## 2018-09-09 ENCOUNTER — Other Ambulatory Visit: Payer: Self-pay | Admitting: Family Medicine

## 2018-09-11 ENCOUNTER — Ambulatory Visit: Payer: BLUE CROSS/BLUE SHIELD | Admitting: Family Medicine

## 2018-12-03 ENCOUNTER — Other Ambulatory Visit: Payer: Self-pay

## 2018-12-04 ENCOUNTER — Other Ambulatory Visit: Payer: Self-pay

## 2018-12-04 ENCOUNTER — Encounter: Payer: Self-pay | Admitting: Family Medicine

## 2018-12-04 ENCOUNTER — Ambulatory Visit (INDEPENDENT_AMBULATORY_CARE_PROVIDER_SITE_OTHER): Payer: BC Managed Care – PPO | Admitting: Family Medicine

## 2018-12-04 VITALS — BP 131/83 | HR 76 | Temp 98.2°F | Wt 185.0 lb

## 2018-12-04 DIAGNOSIS — I10 Essential (primary) hypertension: Secondary | ICD-10-CM

## 2018-12-05 LAB — COMPREHENSIVE METABOLIC PANEL
ALT: 19 IU/L (ref 0–32)
AST: 22 IU/L (ref 0–40)
Albumin/Globulin Ratio: 1.7 (ref 1.2–2.2)
Albumin: 4.8 g/dL (ref 3.8–4.9)
Alkaline Phosphatase: 41 IU/L (ref 39–117)
BUN/Creatinine Ratio: 18 (ref 9–23)
BUN: 18 mg/dL (ref 6–24)
Bilirubin Total: 0.2 mg/dL (ref 0.0–1.2)
CO2: 23 mmol/L (ref 20–29)
Calcium: 9.4 mg/dL (ref 8.7–10.2)
Chloride: 106 mmol/L (ref 96–106)
Creatinine, Ser: 1 mg/dL (ref 0.57–1.00)
GFR calc Af Amer: 73 mL/min/{1.73_m2} (ref >59)
GFR calc non Af Amer: 64 mL/min/{1.73_m2} (ref >59)
Globulin, Total: 2.9 g/dL (ref 1.5–4.5)
Glucose: 94 mg/dL (ref 65–99)
Potassium: 4.5 mmol/L (ref 3.5–5.2)
Sodium: 141 mmol/L (ref 134–144)
Total Protein: 7.7 g/dL (ref 6.0–8.5)

## 2018-12-07 ENCOUNTER — Encounter: Payer: Self-pay | Admitting: Family Medicine

## 2018-12-08 NOTE — Assessment & Plan Note (Signed)
BPs stable and WNL, continue current regimen 

## 2018-12-08 NOTE — Progress Notes (Signed)
BP 131/83   Pulse 76   Temp 98.2 F (36.8 C) (Oral)   Wt 185 lb (83.9 kg)   SpO2 99%   BMI 31.26 kg/m    Subjective:    Patient ID: Nicole Cortez, female    DOB: Nov 06, 1965, 53 y.o.   MRN: [id number]  HPI: Nicole Cortez is a 53 y.o. female  Chief Complaint  Patient presents with  . Hypertension  . Medication Refill   Here today for HTN f/u. Taking her lotrel faithfully without side effects. Denies CP, SOB, HAs, dizziness. Has been under a lot of stress with the recent passing of her significant other but feels she's dealing with things well and this has not affected her BPs at all. Has been exercising and eating healthy. Has not had a soda in over a year!   Relevant past medical, surgical, family and social history reviewed and updated as indicated. Interim medical history since our last visit reviewed. Allergies and medications reviewed and updated.  Review of Systems  Per HPI unless specifically indicated above     Objective:    BP 131/83   Pulse 76   Temp 98.2 F (36.8 C) (Oral)   Wt 185 lb (83.9 kg)   SpO2 99%   BMI 31.26 kg/m   Wt Readings from Last 3 Encounters:  12/04/18 185 lb (83.9 kg)  03/13/18 193 lb 12.8 oz (87.9 kg)  02/11/18 194 lb (88 kg)    Physical Exam Vitals signs and nursing note reviewed.  Constitutional:      Appearance: Normal appearance. She is not ill-appearing.  HENT:     Head: Atraumatic.  Eyes:     Extraocular Movements: Extraocular movements intact.     Conjunctiva/sclera: Conjunctivae normal.  Neck:     Musculoskeletal: Normal range of motion and neck supple.  Cardiovascular:     Rate and Rhythm: Normal rate and regular rhythm.     Heart sounds: Normal heart sounds.  Pulmonary:     Effort: Pulmonary effort is normal.     Breath sounds: Normal breath sounds.  Musculoskeletal: Normal range of motion.  Skin:    General: Skin is warm and dry.  Neurological:     Mental Status: She is alert and oriented to person, place,  and time.  Psychiatric:        Mood and Affect: Mood normal.        Thought Content: Thought content normal.        Judgment: Judgment normal.     Results for orders placed or performed in visit on 12/04/18  Comprehensive metabolic panel  Result Value Ref Range   Glucose 94 65 - 99 mg/dL   BUN 18 6 - 24 mg/dL   Creatinine, Ser 1.00 0.57 - 1.00 mg/dL   GFR calc non Af Amer 64 >59 mL/min/1.73   GFR calc Af Amer 73 >59 mL/min/1.73   BUN/Creatinine Ratio 18 9 - 23   Sodium 141 134 - 144 mmol/L   Potassium 4.5 3.5 - 5.2 mmol/L   Chloride 106 96 - 106 mmol/L   CO2 23 20 - 29 mmol/L   Calcium 9.4 8.7 - 10.2 mg/dL   Total Protein 7.7 6.0 - 8.5 g/dL   Albumin 4.8 3.8 - 4.9 g/dL   Globulin, Total 2.9 1.5 - 4.5 g/dL   Albumin/Globulin Ratio 1.7 1.2 - 2.2   Bilirubin Total 0.2 0.0 - 1.2 mg/dL   Alkaline Phosphatase 41 39 - 117 IU/L   AST  22 0 - 40 IU/L   ALT 19 0 - 32 IU/L      Assessment & Plan:   Problem List Items Addressed This Visit      Cardiovascular and Mediastinum   Essential hypertension - Primary    BPs stable and WNL, continue current regimen      Relevant Orders   Comprehensive metabolic panel (Completed)       Follow up plan: Return in about 6 months (around 06/03/2019) for CPE.

## 2018-12-16 ENCOUNTER — Other Ambulatory Visit: Payer: Self-pay | Admitting: Family Medicine

## 2019-03-12 ENCOUNTER — Other Ambulatory Visit: Payer: Self-pay | Admitting: Family Medicine

## 2019-03-12 NOTE — Telephone Encounter (Signed)
Requested Prescriptions  Pending Prescriptions Disp Refills  . amLODipine-benazepril (LOTREL) 10-20 MG capsule [Pharmacy Med Name: AMLODIPINE-BENAZEPRIL 10-20 MG] 90 capsule 0    Sig: TAKE 1 CAPSULE BY MOUTH EVERY DAY     Cardiovascular: CCB + ACEI Combos Passed - 03/12/2019 12:56 AM      Passed - Cr in normal range and within 180 days    Creatinine, Ser  Date Value Ref Range Status  12/04/2018 1.00 0.57 - 1.00 mg/dL Final         Passed - K in normal range and within 180 days    Potassium  Date Value Ref Range Status  12/04/2018 4.5 3.5 - 5.2 mmol/L Final         Passed - Patient is not pregnant      Passed - Last BP in normal range    BP Readings from Last 1 Encounters:  12/04/18 131/83         Passed - Valid encounter within last 6 months    Recent Outpatient Visits          3 months ago Essential hypertension   [facility] [place], [place]   12 months ago Essential hypertension   [facility] [place], [place] Hews, [place]   1 year ago Essential hypertension   [facility] [place], [place]   1 year ago Essential hypertension   [facility] [place], [place]   1 year ago Essential hypertension   [facility] [place], [place]ws, [place]      Future Appointments            In 2 months Maurice March, [place], PA-C [facility], [facility]

## 2019-06-03 ENCOUNTER — Telehealth: Payer: Self-pay | Admitting: Family Medicine

## 2019-06-03 MED ORDER — AMLODIPINE BESY-BENAZEPRIL HCL 10-20 MG PO CAPS: ORAL_CAPSULE | ORAL | 0 refills | Status: DC

## 2019-06-03 NOTE — Telephone Encounter (Signed)
Rx refilled to bridge 

## 2019-06-03 NOTE — Telephone Encounter (Signed)
Pt presented in office needing to change cpe from 5/24 to 6/2, pt is concerned that her bp medication amlodipine will run out before the appt. She is wanting to see if she can get a refill at least until her appt. Please advise.

## 2019-06-03 NOTE — Telephone Encounter (Signed)
Called pt to let her know of rx refill pt verbalized understanding

## 2019-06-07 ENCOUNTER — Encounter: Payer: BC Managed Care – PPO | Admitting: Family Medicine

## 2019-06-16 ENCOUNTER — Ambulatory Visit (INDEPENDENT_AMBULATORY_CARE_PROVIDER_SITE_OTHER): Payer: BC Managed Care – PPO | Admitting: Family Medicine

## 2019-06-16 ENCOUNTER — Encounter: Payer: Self-pay | Admitting: Family Medicine

## 2019-06-16 ENCOUNTER — Other Ambulatory Visit: Payer: Self-pay

## 2019-06-16 VITALS — BP 122/82 | HR 77 | Temp 98.4°F | Ht 63.5 in | Wt 183.0 lb

## 2019-06-16 DIAGNOSIS — Z0000 Encounter for general adult medical examination without abnormal findings: Secondary | ICD-10-CM

## 2019-06-16 DIAGNOSIS — Z114 Encounter for screening for human immunodeficiency virus [HIV]: Secondary | ICD-10-CM

## 2019-06-16 DIAGNOSIS — Z1159 Encounter for screening for other viral diseases: Secondary | ICD-10-CM | POA: Diagnosis not present

## 2019-06-16 DIAGNOSIS — Z136 Encounter for screening for cardiovascular disorders: Secondary | ICD-10-CM | POA: Diagnosis not present

## 2019-06-16 DIAGNOSIS — G4700 Insomnia, unspecified: Secondary | ICD-10-CM | POA: Diagnosis not present

## 2019-06-16 DIAGNOSIS — I10 Essential (primary) hypertension: Secondary | ICD-10-CM

## 2019-06-16 DIAGNOSIS — Z1211 Encounter for screening for malignant neoplasm of colon: Secondary | ICD-10-CM

## 2019-06-16 DIAGNOSIS — Z1231 Encounter for screening mammogram for malignant neoplasm of breast: Secondary | ICD-10-CM

## 2019-06-16 LAB — UA/M W/RFLX CULTURE, ROUTINE
Bilirubin, UA: NEGATIVE
Glucose, UA: NEGATIVE
Leukocytes,UA: NEGATIVE
Nitrite, UA: NEGATIVE
Protein,UA: NEGATIVE
RBC, UA: NEGATIVE
Specific Gravity, UA: 1.02 (ref 1.005–1.030)
Urobilinogen, Ur: 1 mg/dL (ref 0.2–1.0)
pH, UA: 6 (ref 5.0–7.5)

## 2019-06-16 NOTE — Patient Instructions (Signed)
Please call at this number ([facility]) to schedule your mammogram. [phone] 

## 2019-06-16 NOTE — Assessment & Plan Note (Signed)
Stable and WNL, continue current regimen 

## 2019-06-16 NOTE — Progress Notes (Signed)
BP 122/82   Pulse 77   Temp 98.4 F (36.9 C) (Oral)   Ht 5' 3.5" (1.613 m)   Wt 183 lb (83 kg)   SpO2 99%   BMI 31.91 kg/m    Subjective:    Patient ID: Nicole Cortez, female    DOB: February 11, 1965, 54 y.o.   MRN: [id number]  HPI: Nicole Cortez is a 54 y.o. female presenting on 06/16/2019 for comprehensive medical examination. Current medical complaints include:see below  Still dealing with difficulty sleeping since partner died about 8 months ago. Not on a good schedule anymore and feels very lonely. Has not tried anything OTC. Denies snoring, significant anxiety sxs, apneic episodes.   HTN - taking readings at work which have been 120/80s range. Still trying to eat well, walks daily both at work quite a bit and with her friends in the evenings. Denies side effects to medicine, CP, SOB, HAs, dizziness.   She currently lives with: Menopausal Symptoms: no  Depression Screen done today and results listed below:  Depression screen [facility] 2/9 12/19/2017  Decreased Interest 0  Down, Depressed, Hopeless 0  PHQ - 2 Score 0  Altered sleeping 0  Tired, decreased energy 1  Change in appetite 1  Feeling bad or failure about yourself  0  Trouble concentrating 0  Moving slowly or fidgety/restless 0  Suicidal thoughts 0  PHQ-9 Score 2  Difficult doing work/chores Not difficult at all    The patient does not have a history of falls. I did complete a risk assessment for falls. A plan of care for falls was documented.   Past Medical History:  History reviewed. No pertinent past medical history.  Surgical History:  Past Surgical History:  Procedure Laterality Date  . TUBAL LIGATION      Medications:  Current Outpatient Medications on File Prior to Visit  Medication Sig  . amLODipine-benazepril (LOTREL) 10-20 MG capsule TAKE 1 CAPSULE BY MOUTH EVERY DAY   No current facility-administered medications on file prior to visit.    Allergies:  No Known Allergies  Social History:    Social History   Socioeconomic History  . Marital status: Single    Spouse name: Not on file  . Number of children: Not on file  . Years of education: Not on file  . Highest education level: Not on file  Occupational History  . Not on file  Tobacco Use  . Smoking status: Never Smoker  . Smokeless tobacco: Never Used  Substance and Sexual Activity  . Alcohol use: Yes    Alcohol/week: 3.0 standard drinks    Types: 3 Cans of beer per week    Comment: On Weekends  . Drug use: Never  . Sexual activity: Yes  Other Topics Concern  . Not on file  Social History Narrative  . Not on file   Social Determinants of Health   Financial Resource Strain:   . Difficulty of Paying Living Expenses:   Food Insecurity:   . Worried About Programme researcher, broadcasting/film/video in the Last Year:   . Barista in the Last Year:   Transportation Needs:   . Freight forwarder (Medical):   Marland Kitchen Lack of Transportation (Non-Medical):   Physical Activity:   . Days of Exercise per Week:   . Minutes of Exercise per Session:   Stress:   . Feeling of Stress :   Social Connections:   . Frequency of Communication with Friends and Family:   .  Frequency of Social Gatherings with Friends and Family:   . Attends Religious Services:   . Active Member of Clubs or Organizations:   . Attends Archivist Meetings:   Marland Kitchen Marital Status:   Intimate Partner Violence:   . Fear of Current or Ex-Partner:   . Emotionally Abused:   Marland Kitchen Physically Abused:   . Sexually Abused:    Social History   Tobacco Use  Smoking Status Never Smoker  Smokeless Tobacco Never Used   Social History   Substance and Sexual Activity  Alcohol Use Yes  . Alcohol/week: 3.0 standard drinks  . Types: 3 Cans of beer per week   Comment: On Weekends    Family History:  Family History  Problem Relation Age of Onset  . Hypertension Mother   . Kidney disease Mother   . Hypertension Maternal Aunt   . Kidney disease Maternal Aunt   .  Hypertension Maternal Uncle   . Kidney disease Maternal Uncle     Past medical history, surgical history, medications, allergies, family history and social history reviewed with patient today and changes made to appropriate areas of the chart.   Review of Systems - General ROS: negative Psychological ROS: positive for - sleep disturbances Ophthalmic ROS: negative ENT ROS: negative Allergy and Immunology ROS: negative Hematological and Lymphatic ROS: negative Endocrine ROS: negative Breast ROS: negative for breast lumps Respiratory ROS: no cough, shortness of breath, or wheezing Cardiovascular ROS: no chest pain or dyspnea on exertion Gastrointestinal ROS: no abdominal pain, change in bowel habits, or black or bloody stools Genito-Urinary ROS: no dysuria, trouble voiding, or hematuria Musculoskeletal ROS: negative Neurological ROS: no TIA or stroke symptoms Dermatological ROS: negative All other ROS negative except what is listed above and in the HPI.      Objective:    BP 122/82   Pulse 77   Temp 98.4 F (36.9 C) (Oral)   Ht 5' 3.5" (1.613 m)   Wt 183 lb (83 kg)   SpO2 99%   BMI 31.91 kg/m   Wt Readings from Last 3 Encounters:  06/16/19 183 lb (83 kg)  12/04/18 185 lb (83.9 kg)  03/13/18 193 lb 12.8 oz (87.9 kg)    Physical Exam Vitals and nursing note reviewed.  Constitutional:      General: She is not in acute distress.    Appearance: She is well-developed.  HENT:     Head: Atraumatic.     Right Ear: External ear normal.     Left Ear: External ear normal.     Nose: Nose normal.     Mouth/Throat:     Pharynx: No oropharyngeal exudate.  Eyes:     General: No scleral icterus.    Conjunctiva/sclera: Conjunctivae normal.     Pupils: Pupils are equal, round, and reactive to light.  Neck:     Thyroid: No thyromegaly.  Cardiovascular:     Rate and Rhythm: Normal rate and regular rhythm.     Heart sounds: Normal heart sounds.  Pulmonary:     Effort: Pulmonary  effort is normal. No respiratory distress.     Breath sounds: Normal breath sounds.  Chest:     Breasts:        Right: No mass, skin change or tenderness.        Left: No mass, skin change or tenderness.  Abdominal:     General: Bowel sounds are normal.     Palpations: Abdomen is soft. There is no mass.  Tenderness: There is no abdominal tenderness.  Musculoskeletal:        General: No tenderness. Normal range of motion.     Cervical back: Normal range of motion and neck supple.  Lymphadenopathy:     Cervical: No cervical adenopathy.     Upper Body:     Right upper body: No axillary adenopathy.     Left upper body: No axillary adenopathy.  Skin:    General: Skin is warm and dry.     Findings: No rash.  Neurological:     Mental Status: She is alert and oriented to person, place, and time.     Cranial Nerves: No cranial nerve deficit.  Psychiatric:        Behavior: Behavior normal.     Results for orders placed or performed in visit on 12/04/18  Comprehensive metabolic panel  Result Value Ref Range   Glucose 94 65 - 99 mg/dL   BUN 18 6 - 24 mg/dL   Creatinine, Ser 3.14 0.57 - 1.00 mg/dL   GFR calc non Af Amer 64 >59 mL/min/1.73   GFR calc Af Amer 73 >59 mL/min/1.73   BUN/Creatinine Ratio 18 9 - 23   Sodium 141 134 - 144 mmol/L   Potassium 4.5 3.5 - 5.2 mmol/L   Chloride 106 96 - 106 mmol/L   CO2 23 20 - 29 mmol/L   Calcium 9.4 8.7 - 10.2 mg/dL   Total Protein 7.7 6.0 - 8.5 g/dL   Albumin 4.8 3.8 - 4.9 g/dL   Globulin, Total 2.9 1.5 - 4.5 g/dL   Albumin/Globulin Ratio 1.7 1.2 - 2.2   Bilirubin Total 0.2 0.0 - 1.2 mg/dL   Alkaline Phosphatase 41 39 - 117 IU/L   AST 22 0 - 40 IU/L   ALT 19 0 - 32 IU/L      Assessment & Plan:   Problem List Items Addressed This Visit      Cardiovascular and Mediastinum   Essential hypertension - Primary    Stable and WNL, continue current regimen      Relevant Orders   CBC with Differential/Platelet   Comprehensive  metabolic panel   TSH   UA/M w/rflx Culture, Routine    Other Visit Diagnoses    Annual physical exam       Insomnia, unspecified type       Discussed natural remedies such as melatonin, chamomile tea, white noise machines, exercise, sleep training therapies   Encounter for screening for HIV       Relevant Orders   HIV Antibody (routine testing w rflx)   Need for hepatitis C screening test       Relevant Orders   Hepatitis C antibody   Colon cancer screening       Relevant Orders   Ambulatory referral to Gastroenterology   Encounter for screening mammogram for malignant neoplasm of breast       Relevant Orders   MM DIGITAL SCREENING BILATERAL   Screening for cardiovascular condition       Relevant Orders   Lipid Panel w/o Chol/HDL Ratio       Follow up plan: Return in about 6 months (around 12/16/2019) for 6 month f/u.   LABORATORY TESTING:  - Pap smear: up to date  IMMUNIZATIONS:   - Tdap: Tetanus vaccination status reviewed: last tetanus booster within 10 years. - Influenza: Postponed to flu season  SCREENING: -Mammogram: Ordered today  - Colonoscopy: Ordered today   PATIENT COUNSELING:   Advised to  take 1 mg of folate supplement per day if capable of pregnancy.   Sexuality: Discussed sexually transmitted diseases, partner selection, use of condoms, avoidance of unintended pregnancy  and contraceptive alternatives.   Advised to avoid cigarette smoking.  I discussed with the patient that most people either abstain from alcohol or drink within safe limits (<=14/week and <=4 drinks/occasion for males, <=7/weeks and <= 3 drinks/occasion for females) and that the risk for alcohol disorders and other health effects rises proportionally with the number of drinks per week and how often a drinker exceeds daily limits.  Discussed cessation/primary prevention of drug use and availability of treatment for abuse.   Diet: Encouraged to adjust caloric intake to maintain  or  achieve ideal body weight, to reduce intake of dietary saturated fat and total fat, to limit sodium intake by avoiding high sodium foods and not adding table salt, and to maintain adequate dietary potassium and calcium preferably from fresh fruits, vegetables, and low-fat dairy products.    stressed the importance of regular exercise  Injury prevention: Discussed safety belts, safety helmets, smoke detector, smoking near bedding or upholstery.   Dental health: Discussed importance of regular tooth brushing, flossing, and dental visits.    NEXT PREVENTATIVE PHYSICAL DUE IN 1 YEAR. Return in about 6 months (around 12/16/2019) for 6 month f/u.

## 2019-06-17 LAB — COMPREHENSIVE METABOLIC PANEL
ALT: 20 IU/L (ref 0–32)
AST: 26 IU/L (ref 0–40)
Albumin/Globulin Ratio: 1.5 (ref 1.2–2.2)
Albumin: 4.8 g/dL (ref 3.8–4.9)
Alkaline Phosphatase: 40 IU/L — ABNORMAL LOW (ref 48–121)
BUN/Creatinine Ratio: 21 (ref 9–23)
BUN: 21 mg/dL (ref 6–24)
Bilirubin Total: 0.2 mg/dL (ref 0.0–1.2)
CO2: 23 mmol/L (ref 20–29)
Calcium: 9.7 mg/dL (ref 8.7–10.2)
Chloride: 102 mmol/L (ref 96–106)
Creatinine, Ser: 1.01 mg/dL — ABNORMAL HIGH (ref 0.57–1.00)
GFR calc Af Amer: 72 mL/min/{1.73_m2} (ref >59)
GFR calc non Af Amer: 63 mL/min/{1.73_m2} (ref >59)
Globulin, Total: 3.2 g/dL (ref 1.5–4.5)
Glucose: 91 mg/dL (ref 65–99)
Potassium: 4.6 mmol/L (ref 3.5–5.2)
Sodium: 137 mmol/L (ref 134–144)
Total Protein: 8 g/dL (ref 6.0–8.5)

## 2019-06-17 LAB — CBC WITH DIFFERENTIAL/PLATELET
Basophils Absolute: 0 10*3/uL (ref 0.0–0.2)
Basos: 1 %
EOS (ABSOLUTE): 0.2 10*3/uL (ref 0.0–0.4)
Eos: 5 %
Hematocrit: 35 % (ref 34.0–46.6)
Hemoglobin: 11.3 g/dL (ref 11.1–15.9)
Immature Grans (Abs): 0 10*3/uL (ref 0.0–0.1)
Immature Granulocytes: 0 %
Lymphocytes Absolute: 1.7 10*3/uL (ref 0.7–3.1)
Lymphs: 36 %
MCH: 29.3 pg (ref 26.6–33.0)
MCHC: 32.3 g/dL (ref 31.5–35.7)
MCV: 91 fL (ref 79–97)
Monocytes Absolute: 0.7 10*3/uL (ref 0.1–0.9)
Monocytes: 15 %
Neutrophils Absolute: 2 10*3/uL (ref 1.4–7.0)
Neutrophils: 43 %
Platelets: 358 10*3/uL (ref 150–450)
RBC: 3.86 x10E6/uL (ref 3.77–5.28)
RDW: 14.1 % (ref 11.7–15.4)
WBC: 4.7 10*3/uL (ref 3.4–10.8)

## 2019-06-17 LAB — LIPID PANEL W/O CHOL/HDL RATIO
Cholesterol, Total: 257 mg/dL — ABNORMAL HIGH (ref 100–199)
HDL: 116 mg/dL (ref >39)
LDL Chol Calc (NIH): 130 mg/dL — ABNORMAL HIGH (ref 0–99)
Triglycerides: 71 mg/dL (ref 0–149)
VLDL Cholesterol Cal: 11 mg/dL (ref 5–40)

## 2019-06-17 LAB — HEPATITIS C ANTIBODY: Hep C Virus Ab: <0.1 s/co ratio (ref 0.0–0.9)

## 2019-06-17 LAB — HIV ANTIBODY (ROUTINE TESTING W REFLEX): HIV Screen 4th Generation wRfx: NONREACTIVE

## 2019-06-17 LAB — TSH: TSH: 2.67 u[IU]/mL (ref 0.450–4.500)

## 2019-06-18 ENCOUNTER — Encounter: Payer: Self-pay | Admitting: Family Medicine

## 2019-06-24 ENCOUNTER — Telehealth: Payer: BLUE CROSS/BLUE SHIELD

## 2019-08-23 ENCOUNTER — Encounter: Payer: Self-pay | Admitting: Family Medicine

## 2019-09-03 ENCOUNTER — Other Ambulatory Visit: Payer: Self-pay | Admitting: Family Medicine

## 2019-09-28 ENCOUNTER — Other Ambulatory Visit: Payer: Self-pay | Admitting: Family Medicine

## 2019-09-28 NOTE — Telephone Encounter (Signed)
Requested Prescriptions  Pending Prescriptions Disp Refills  . amLODipine-benazepril (LOTREL) 10-20 MG capsule [Pharmacy Med Name: AMLODIPINE-BENAZEPRIL 10-20 MG] 90 capsule 1    Sig: TAKE 1 CAPSULE BY MOUTH EVERY DAY     Cardiovascular: CCB + ACEI Combos Failed - 09/28/2019  8:56 AM      Failed - Cr in normal range and within 180 days    Creatinine, Ser  Date Value Ref Range Status  06/16/2019 1.01 (H) 0.57 - 1.00 mg/dL Final         Passed - K in normal range and within 180 days    Potassium  Date Value Ref Range Status  06/16/2019 4.6 3.5 - 5.2 mmol/L Final         Passed - Patient is not pregnant      Passed - Last BP in normal range    BP Readings from Last 1 Encounters:  06/16/19 122/82         Passed - Valid encounter within last 6 months    Recent Outpatient Visits          3 months ago Essential hypertension   [facility] Particia Nearing, [place]   9 months ago Essential hypertension   [facility] Particia Nearing, [place]   1 year ago Essential hypertension   [facility] Particia Nearing, [place]   1 year ago Essential hypertension   [facility] Particia Nearing, [place]   1 year ago Essential hypertension   [facility], Salley Hews, [place]      Future Appointments            In 2 months Laural Benes, Oralia Rud, DO [employer], [facility]

## 2019-12-16 ENCOUNTER — Ambulatory Visit (INDEPENDENT_AMBULATORY_CARE_PROVIDER_SITE_OTHER): Payer: BC Managed Care – PPO | Admitting: Family Medicine

## 2019-12-16 ENCOUNTER — Other Ambulatory Visit: Payer: Self-pay

## 2019-12-16 ENCOUNTER — Ambulatory Visit: Payer: BC Managed Care – PPO | Admitting: Family Medicine

## 2019-12-16 ENCOUNTER — Encounter: Payer: Self-pay | Admitting: Family Medicine

## 2019-12-16 VITALS — BP 118/77 | HR 80 | Temp 98.6°F | Ht 64.09 in | Wt 183.6 lb

## 2019-12-16 DIAGNOSIS — I10 Essential (primary) hypertension: Secondary | ICD-10-CM

## 2019-12-16 DIAGNOSIS — K061 Gingival enlargement: Secondary | ICD-10-CM | POA: Diagnosis not present

## 2019-12-16 MED ORDER — BENAZEPRIL HCL 40 MG PO TABS: 40.0000 mg | ORAL_TABLET | Freq: Every day | ORAL | 3 refills | Status: DC

## 2019-12-16 NOTE — Progress Notes (Signed)
BP 118/77   Pulse 80   Temp 98.6 F (37 C) (Oral)   Ht 5' 4.09" (1.628 m)   Wt 183 lb 9.6 oz (83.3 kg)   SpO2 99%   BMI 31.42 kg/m    Subjective:    Patient ID: Nicole Cortez, female    DOB: May 20, 1965, 54 y.o.   MRN: [id number]  HPI: Nicole Cortez is a 54 y.o. female  Chief Complaint  Patient presents with  . Hypertension  . Oral Swelling    went to dentist, that this could be a side effect of bp med   HYPERTENSION Hypertension status: controlled  Satisfied with current treatment? no Duration of hypertension: chronic BP monitoring frequency:  not checking BP medication side effects:  Amlodipine- causing gingival swelling Medication compliance: excellent compliance Previous BP meds: amlodipine, benazepril Aspirin: no Recurrent headaches: no Visual changes: no Palpitations: no Dyspnea: no Chest pain: no Lower extremity edema: no Dizzy/lightheaded: no   Relevant past medical, surgical, family and social history reviewed and updated as indicated. Interim medical history since our last visit reviewed. Allergies and medications reviewed and updated.  Review of Systems  Constitutional: Negative.   HENT: Negative.   Respiratory: Negative.   Cardiovascular: Negative.   Gastrointestinal: Negative.   Musculoskeletal: Negative.   Psychiatric/Behavioral: Negative.     Per HPI unless specifically indicated above     Objective:    BP 118/77   Pulse 80   Temp 98.6 F (37 C) (Oral)   Ht 5' 4.09" (1.628 m)   Wt 183 lb 9.6 oz (83.3 kg)   SpO2 99%   BMI 31.42 kg/m   Wt Readings from Last 3 Encounters:  12/16/19 183 lb 9.6 oz (83.3 kg)  06/16/19 183 lb (83 kg)  12/04/18 185 lb (83.9 kg)    Physical Exam Vitals and nursing note reviewed.  Constitutional:      General: She is not in acute distress.    Appearance: Normal appearance. She is not ill-appearing, toxic-appearing or diaphoretic.  HENT:     Head: Normocephalic and atraumatic.     Right Ear:  External ear normal.     Left Ear: External ear normal.     Nose: Nose normal.     Mouth/Throat:     Mouth: Mucous membranes are moist.     Pharynx: Oropharynx is clear.  Eyes:     General: No scleral icterus.       Right eye: No discharge.        Left eye: No discharge.     Extraocular Movements: Extraocular movements intact.     Conjunctiva/sclera: Conjunctivae normal.     Pupils: Pupils are equal, round, and reactive to light.  Cardiovascular:     Rate and Rhythm: Normal rate and regular rhythm.     Pulses: Normal pulses.     Heart sounds: Normal heart sounds. No murmur heard.  No friction rub. No gallop.   Pulmonary:     Effort: Pulmonary effort is normal. No respiratory distress.     Breath sounds: Normal breath sounds. No stridor. No wheezing, rhonchi or rales.  Chest:     Chest wall: No tenderness.  Musculoskeletal:        General: Normal range of motion.     Cervical back: Normal range of motion and neck supple.  Skin:    General: Skin is warm and dry.     Capillary Refill: Capillary refill takes less than 2 seconds.  Coloration: Skin is not jaundiced or pale.     Findings: No bruising, erythema, lesion or rash.  Neurological:     General: No focal deficit present.     Mental Status: She is alert and oriented to person, place, and time. Mental status is at baseline.  Psychiatric:        Mood and Affect: Mood normal.        Behavior: Behavior normal.        Thought Content: Thought content normal.        Judgment: Judgment normal.     Results for orders placed or performed in visit on 06/16/19  HIV Antibody (routine testing w rflx)  Result Value Ref Range   HIV Screen 4th Generation wRfx Non Reactive Non Reactive  Hepatitis C antibody  Result Value Ref Range   Hep C Virus Ab <0.1 0.0 - 0.9 s/co ratio  CBC with Differential/Platelet  Result Value Ref Range   WBC 4.7 3.4 - 10.8 x10E3/uL   RBC 3.86 3.77 - 5.28 x10E6/uL   Hemoglobin 11.3 11.1 - 15.9 g/dL    Hematocrit 93.7 90.2 - 46.6 %   MCV 91 79 - 97 fL   MCH 29.3 26.6 - 33.0 pg   MCHC 32.3 31 - 35 g/dL   RDW 40.9 73.5 - 32.9 %   Platelets 358 150 - 450 x10E3/uL   Neutrophils 43 Not Estab. %   Lymphs 36 Not Estab. %   Monocytes 15 Not Estab. %   Eos 5 Not Estab. %   Basos 1 Not Estab. %   Neutrophils Absolute 2.0 1.40 - 7.00 x10E3/uL   Lymphocytes Absolute 1.7 0 - 3 x10E3/uL   Monocytes Absolute 0.7 0 - 0 x10E3/uL   EOS (ABSOLUTE) 0.2 0.0 - 0.4 x10E3/uL   Basophils Absolute 0.0 0 - 0 x10E3/uL   Immature Granulocytes 0 Not Estab. %   Immature Grans (Abs) 0.0 0.0 - 0.1 x10E3/uL  Comprehensive metabolic panel  Result Value Ref Range   Glucose 91 65 - 99 mg/dL   BUN 21 6 - 24 mg/dL   Creatinine, Ser 9.24 (H) 0.57 - 1.00 mg/dL   GFR calc non Af Amer 63 >59 mL/min/1.73   GFR calc Af Amer 72 >59 mL/min/1.73   BUN/Creatinine Ratio 21 9 - 23   Sodium 137 134 - 144 mmol/L   Potassium 4.6 3.5 - 5.2 mmol/L   Chloride 102 96 - 106 mmol/L   CO2 23 20 - 29 mmol/L   Calcium 9.7 8.7 - 10.2 mg/dL   Total Protein 8.0 6.0 - 8.5 g/dL   Albumin 4.8 3.8 - 4.9 g/dL   Globulin, Total 3.2 1.5 - 4.5 g/dL   Albumin/Globulin Ratio 1.5 1.2 - 2.2   Bilirubin Total 0.2 0.0 - 1.2 mg/dL   Alkaline Phosphatase 40 (L) 48 - 121 IU/L   AST 26 0 - 40 IU/L   ALT 20 0 - 32 IU/L  Lipid Panel w/o Chol/HDL Ratio  Result Value Ref Range   Cholesterol, Total 257 (H) 100 - 199 mg/dL   Triglycerides 71 0 - 149 mg/dL   HDL 268 >34 mg/dL   VLDL Cholesterol Cal 11 5 - 40 mg/dL   LDL Chol Calc (NIH) 196 (H) 0 - 99 mg/dL  TSH  Result Value Ref Range   TSH 2.670 0.450 - 4.500 uIU/mL  UA/M w/rflx Culture, Routine   Specimen: Urine   URINE  Result Value Ref Range   Specific Gravity, UA 1.020  1.005 - 1.030   pH, UA 6.0 5.0 - 7.5   Color, UA Yellow Yellow   Appearance Ur Clear Clear   Leukocytes,UA Negative Negative   Protein,UA Negative Negative/Trace   Glucose, UA Negative Negative   Ketones, UA 1+ (A) Negative     RBC, UA Negative Negative   Bilirubin, UA Negative Negative   Urobilinogen, Ur 1.0 0.2 - 1.0 mg/dL   Nitrite, UA Negative Negative      Assessment & Plan:   Problem List Items Addressed This Visit      Cardiovascular and Mediastinum   Essential hypertension - Primary    BP is doing great, but amlodipine is causing gingival hypertrophy. Will stop it on recommendation of her dentist and increase her benazepril to 40mg . Recheck 3-4 weeks with BMP at that time. Call with any concerns.       Relevant Medications   benazepril (LOTENSIN) 40 MG tablet    Other Visit Diagnoses    Gingival hypertrophy       Thought to be due to her amlodipine. Will stop it. Recheck 3-4 weeks. Continue to follow with dentistry.       Follow up plan: Return 3-4 weeks,.

## 2019-12-16 NOTE — Assessment & Plan Note (Signed)
BP is doing great, but amlodipine is causing gingival hypertrophy. Will stop it on recommendation of her dentist and increase her benazepril to 40mg . Recheck 3-4 weeks with BMP at that time. Call with any concerns.

## 2020-01-12 ENCOUNTER — Encounter: Payer: Self-pay | Admitting: Family Medicine

## 2020-01-12 ENCOUNTER — Other Ambulatory Visit: Payer: Self-pay

## 2020-01-12 ENCOUNTER — Ambulatory Visit (INDEPENDENT_AMBULATORY_CARE_PROVIDER_SITE_OTHER): Payer: BC Managed Care – PPO | Admitting: Family Medicine

## 2020-01-12 VITALS — BP 162/102 | HR 66 | Temp 98.5°F | Wt 183.8 lb

## 2020-01-12 DIAGNOSIS — I10 Essential (primary) hypertension: Secondary | ICD-10-CM | POA: Diagnosis not present

## 2020-01-12 MED ORDER — HYDROCHLOROTHIAZIDE 25 MG PO TABS: 25.0000 mg | ORAL_TABLET | Freq: Every day | ORAL | 1 refills | Status: DC

## 2020-01-12 NOTE — Progress Notes (Signed)
BP (!) 162/102   Pulse 66   Temp 98.5 F (36.9 C)   Wt 183 lb 12.8 oz (83.4 kg)   SpO2 98%   BMI 31.46 kg/m    Subjective:    Patient ID: Nicole Cortez, female    DOB: 02-09-1965, 54 y.o.   MRN: [id number]  HPI: Nicole Cortez is a 54 y.o. female  Chief Complaint  Patient presents with  . Hypertension  . gingival hypertrophy   HYPERTENSION Hypertension status: uncontrolled  Satisfied with current treatment? no Duration of hypertension: chronic BP monitoring frequency:  a few times a week BP range: 120s-130s/80s BP medication side effects:  no Medication compliance: excellent compliance Previous BP meds: Aspirin: no Recurrent headaches: no Visual changes: no Palpitations: no Dyspnea: no Chest pain: no Lower extremity edema: no Dizzy/lightheaded: no  Relevant past medical, surgical, family and social history reviewed and updated as indicated. Interim medical history since our last visit reviewed. Allergies and medications reviewed and updated.  Review of Systems  Constitutional: Negative.   Respiratory: Negative.   Cardiovascular: Negative.   Gastrointestinal: Negative.   Psychiatric/Behavioral: Negative.     Per HPI unless specifically indicated above     Objective:    BP (!) 162/102   Pulse 66   Temp 98.5 F (36.9 C)   Wt 183 lb 12.8 oz (83.4 kg)   SpO2 98%   BMI 31.46 kg/m   Wt Readings from Last 3 Encounters:  01/12/20 183 lb 12.8 oz (83.4 kg)  12/16/19 183 lb 9.6 oz (83.3 kg)  06/16/19 183 lb (83 kg)    Physical Exam Vitals and nursing note reviewed.  Constitutional:      General: She is not in acute distress.    Appearance: Normal appearance. She is not ill-appearing, toxic-appearing or diaphoretic.  HENT:     Head: Normocephalic and atraumatic.     Right Ear: External ear normal.     Left Ear: External ear normal.     Nose: Nose normal.     Mouth/Throat:     Mouth: Mucous membranes are moist.     Pharynx: Oropharynx is clear.   Eyes:     General: No scleral icterus.       Right eye: No discharge.        Left eye: No discharge.     Extraocular Movements: Extraocular movements intact.     Conjunctiva/sclera: Conjunctivae normal.     Pupils: Pupils are equal, round, and reactive to light.  Cardiovascular:     Rate and Rhythm: Normal rate and regular rhythm.     Pulses: Normal pulses.     Heart sounds: Normal heart sounds. No murmur heard. No friction rub. No gallop.   Pulmonary:     Effort: Pulmonary effort is normal. No respiratory distress.     Breath sounds: Normal breath sounds. No stridor. No wheezing, rhonchi or rales.  Chest:     Chest wall: No tenderness.  Musculoskeletal:        General: Normal range of motion.     Cervical back: Normal range of motion and neck supple.  Skin:    General: Skin is warm and dry.     Capillary Refill: Capillary refill takes less than 2 seconds.     Coloration: Skin is not jaundiced or pale.     Findings: No bruising, erythema, lesion or rash.  Neurological:     General: No focal deficit present.     Mental Status: She is  alert and oriented to person, place, and time. Mental status is at baseline.  Psychiatric:        Mood and Affect: Mood normal.        Behavior: Behavior normal.        Thought Content: Thought content normal.        Judgment: Judgment normal.     Results for orders placed or performed in visit on 06/16/19  HIV Antibody (routine testing w rflx)  Result Value Ref Range   HIV Screen 4th Generation wRfx Non Reactive Non Reactive  Hepatitis C antibody  Result Value Ref Range   Hep C Virus Ab <0.1 0.0 - 0.9 s/co ratio  CBC with Differential/Platelet  Result Value Ref Range   WBC 4.7 3.4 - 10.8 x10E3/uL   RBC 3.86 3.77 - 5.28 x10E6/uL   Hemoglobin 11.3 11.1 - 15.9 g/dL   Hematocrit 18.2 99.3 - 46.6 %   MCV 91 79 - 97 fL   MCH 29.3 26.6 - 33.0 pg   MCHC 32.3 31.5 - 35.7 g/dL   RDW 71.6 96.7 - 89.3 %   Platelets 358 150 - 450 x10E3/uL    Neutrophils 43 Not Estab. %   Lymphs 36 Not Estab. %   Monocytes 15 Not Estab. %   Eos 5 Not Estab. %   Basos 1 Not Estab. %   Neutrophils Absolute 2.0 1.4 - 7.0 x10E3/uL   Lymphocytes Absolute 1.7 0.7 - 3.1 x10E3/uL   Monocytes Absolute 0.7 0.1 - 0.9 x10E3/uL   EOS (ABSOLUTE) 0.2 0.0 - 0.4 x10E3/uL   Basophils Absolute 0.0 0.0 - 0.2 x10E3/uL   Immature Granulocytes 0 Not Estab. %   Immature Grans (Abs) 0.0 0.0 - 0.1 x10E3/uL  Comprehensive metabolic panel  Result Value Ref Range   Glucose 91 65 - 99 mg/dL   BUN 21 6 - 24 mg/dL   Creatinine, Ser 8.10 (H) 0.57 - 1.00 mg/dL   GFR calc non Af Amer 63 >59 mL/min/1.73   GFR calc Af Amer 72 >59 mL/min/1.73   BUN/Creatinine Ratio 21 9 - 23   Sodium 137 134 - 144 mmol/L   Potassium 4.6 3.5 - 5.2 mmol/L   Chloride 102 96 - 106 mmol/L   CO2 23 20 - 29 mmol/L   Calcium 9.7 8.7 - 10.2 mg/dL   Total Protein 8.0 6.0 - 8.5 g/dL   Albumin 4.8 3.8 - 4.9 g/dL   Globulin, Total 3.2 1.5 - 4.5 g/dL   Albumin/Globulin Ratio 1.5 1.2 - 2.2   Bilirubin Total 0.2 0.0 - 1.2 mg/dL   Alkaline Phosphatase 40 (L) 48 - 121 IU/L   AST 26 0 - 40 IU/L   ALT 20 0 - 32 IU/L  Lipid Panel w/o Chol/HDL Ratio  Result Value Ref Range   Cholesterol, Total 257 (H) 100 - 199 mg/dL   Triglycerides 71 0 - 149 mg/dL   HDL 175 >10 mg/dL   VLDL Cholesterol Cal 11 5 - 40 mg/dL   LDL Chol Calc (NIH) 258 (H) 0 - 99 mg/dL  TSH  Result Value Ref Range   TSH 2.670 0.450 - 4.500 uIU/mL  UA/M w/rflx Culture, Routine   Specimen: Urine   URINE  Result Value Ref Range   Specific Gravity, UA 1.020 1.005 - 1.030   pH, UA 6.0 5.0 - 7.5   Color, UA Yellow Yellow   Appearance Ur Clear Clear   Leukocytes,UA Negative Negative   Protein,UA Negative Negative/Trace   Glucose, UA  Negative Negative   Ketones, UA 1+ (A) Negative   RBC, UA Negative Negative   Bilirubin, UA Negative Negative   Urobilinogen, Ur 1.0 0.2 - 1.0 mg/dL   Nitrite, UA Negative Negative      Assessment &  Plan:   Problem List Items Addressed This Visit      Cardiovascular and Mediastinum   Essential hypertension - Primary    Doing well at home, but running high today. Will add HCTZ and continue benazepril. Recheck 3-4 weeks. Call with any concerns.       Relevant Medications   hydrochlorothiazide (HYDRODIURIL) 25 MG tablet   Other Relevant Orders   Basic metabolic panel       Follow up plan: Return 3-4 weeks.

## 2020-01-12 NOTE — Assessment & Plan Note (Signed)
Doing well at home, but running high today. Will add HCTZ and continue benazepril. Recheck 3-4 weeks. Call with any concerns.

## 2020-01-13 ENCOUNTER — Encounter: Payer: Self-pay | Admitting: Family Medicine

## 2020-01-13 LAB — BASIC METABOLIC PANEL
BUN/Creatinine Ratio: 13 (ref 9–23)
BUN: 14 mg/dL (ref 6–24)
CO2: 22 mmol/L (ref 20–29)
Calcium: 9.6 mg/dL (ref 8.7–10.2)
Chloride: 101 mmol/L (ref 96–106)
Creatinine, Ser: 1.05 mg/dL — ABNORMAL HIGH (ref 0.57–1.00)
GFR calc Af Amer: 69 mL/min/{1.73_m2} (ref >59)
GFR calc non Af Amer: 60 mL/min/{1.73_m2} (ref >59)
Glucose: 98 mg/dL (ref 65–99)
Potassium: 4.7 mmol/L (ref 3.5–5.2)
Sodium: 138 mmol/L (ref 134–144)

## 2020-02-02 ENCOUNTER — Encounter: Payer: Self-pay | Admitting: Family Medicine

## 2020-02-02 ENCOUNTER — Other Ambulatory Visit: Payer: Self-pay

## 2020-02-02 ENCOUNTER — Ambulatory Visit (INDEPENDENT_AMBULATORY_CARE_PROVIDER_SITE_OTHER): Payer: BC Managed Care – PPO | Admitting: Family Medicine

## 2020-02-02 VITALS — BP 116/86 | HR 97 | Temp 98.2°F

## 2020-02-02 DIAGNOSIS — I10 Essential (primary) hypertension: Secondary | ICD-10-CM | POA: Diagnosis not present

## 2020-02-02 NOTE — Assessment & Plan Note (Signed)
Well controlled without hypotensive sx. Will obtain BMP today given new med start previously. F/u in 3 months.

## 2020-02-02 NOTE — Progress Notes (Signed)
   SUBJECTIVE:   CHIEF COMPLAINT / HPI:   Patient Active Problem List   Diagnosis Date Noted  . Essential hypertension 09/04/2017   Hypertension: - Medications: benazepril 40mg , HCTZ 25mg  (added at last visit) - Compliance: good - Checking BP at home: no - Denies any SOB, CP, vision changes, LE edema, medication SEs, or symptoms of hypotension   OBJECTIVE:   BP 116/86   Pulse 97   Temp 98.2 F (36.8 C)   SpO2 98%   Gen: well appearing, in NAD Card: RRR, no murmur Lungs: CTAB Ext: WWP, no edema  ASSESSMENT/PLAN:   Essential hypertension Well controlled without hypotensive sx. Will obtain BMP today given new med start previously. F/u in 3 months.    , DO

## 2020-02-02 NOTE — Patient Instructions (Signed)
It was great to see you!  Our plans for today:  - We are checking some labs today, we will release these results to your MyChart. - Come back in 3 months.  Take care and seek immediate care sooner if you develop any concerns.   Dr. Linwood Dibbles

## 2020-02-03 LAB — BASIC METABOLIC PANEL
BUN/Creatinine Ratio: 21 (ref 9–23)
BUN: 30 mg/dL — ABNORMAL HIGH (ref 6–24)
CO2: 25 mmol/L (ref 20–29)
Calcium: 9.7 mg/dL (ref 8.7–10.2)
Chloride: 103 mmol/L (ref 96–106)
Creatinine, Ser: 1.42 mg/dL — ABNORMAL HIGH (ref 0.57–1.00)
GFR calc Af Amer: 48 mL/min/{1.73_m2} — ABNORMAL LOW (ref >59)
GFR calc non Af Amer: 41 mL/min/{1.73_m2} — ABNORMAL LOW (ref >59)
Glucose: 100 mg/dL — ABNORMAL HIGH (ref 65–99)
Potassium: 4.5 mmol/L (ref 3.5–5.2)
Sodium: 140 mmol/L (ref 134–144)

## 2020-02-03 NOTE — Addendum Note (Signed)
Addended by: Caro Laroche on: 02/03/2020 09:12 AM   Modules accepted: Orders

## 2020-03-06 ENCOUNTER — Other Ambulatory Visit: Payer: Self-pay

## 2020-03-06 ENCOUNTER — Other Ambulatory Visit: Payer: BC Managed Care – PPO

## 2020-03-06 DIAGNOSIS — I10 Essential (primary) hypertension: Secondary | ICD-10-CM | POA: Diagnosis not present

## 2020-03-07 LAB — BASIC METABOLIC PANEL
BUN/Creatinine Ratio: 18 (ref 9–23)
BUN: 22 mg/dL (ref 6–24)
CO2: 21 mmol/L (ref 20–29)
Calcium: 10.4 mg/dL — ABNORMAL HIGH (ref 8.7–10.2)
Chloride: 99 mmol/L (ref 96–106)
Creatinine, Ser: 1.22 mg/dL — ABNORMAL HIGH (ref 0.57–1.00)
GFR calc Af Amer: 57 mL/min/{1.73_m2} — ABNORMAL LOW (ref >59)
GFR calc non Af Amer: 50 mL/min/{1.73_m2} — ABNORMAL LOW (ref >59)
Glucose: 108 mg/dL — ABNORMAL HIGH (ref 65–99)
Potassium: 4.1 mmol/L (ref 3.5–5.2)
Sodium: 140 mmol/L (ref 134–144)

## 2020-05-03 ENCOUNTER — Ambulatory Visit: Payer: BC Managed Care – PPO | Admitting: Nurse Practitioner

## 2020-05-05 ENCOUNTER — Ambulatory Visit (INDEPENDENT_AMBULATORY_CARE_PROVIDER_SITE_OTHER): Payer: BC Managed Care – PPO | Admitting: Nurse Practitioner

## 2020-05-05 ENCOUNTER — Encounter: Payer: Self-pay | Admitting: Nurse Practitioner

## 2020-05-05 ENCOUNTER — Other Ambulatory Visit: Payer: Self-pay

## 2020-05-05 VITALS — BP 128/84 | HR 76 | Temp 98.2°F | Wt 187.4 lb

## 2020-05-05 DIAGNOSIS — I10 Essential (primary) hypertension: Secondary | ICD-10-CM

## 2020-05-05 NOTE — Progress Notes (Signed)
Established Patient Office Visit  Subjective:  Patient ID: Nicole Cortez, female    DOB: December 30, 1965  Age: 55 y.o. MRN: [id number]  CC:  Chief Complaint  Patient presents with  . Hypertension    HPI Nicole Cortez presents for follow-up with hypertension  HYPERTENSION  Hypertension status: controlled  Satisfied with current treatment? yes Duration of hypertension: chronic BP monitoring frequency:  not checking BP range: n/a  BP medication side effects:  no Medication compliance: excellent compliance Previous BP meds:benazepril and HCTZ Aspirin: no Recurrent headaches: no Visual changes: no Palpitations: no Dyspnea: no Chest pain: no Lower extremity edema: no Dizzy/lightheaded: no   Past Medical History:  Diagnosis Date  . Hypertension     Past Surgical History:  Procedure Laterality Date  . TUBAL LIGATION      Family History  Problem Relation Age of Onset  . Hypertension Mother   . Kidney disease Mother   . Hypertension Maternal Aunt   . Kidney disease Maternal Aunt   . Hypertension Maternal Uncle   . Kidney disease Maternal Uncle   . Kidney disease Maternal Grandmother     Social History   Socioeconomic History  . Marital status: Single    Spouse name: Not on file  . Number of children: Not on file  . Years of education: Not on file  . Highest education level: Not on file  Occupational History  . Not on file  Tobacco Use  . Smoking status: Never Smoker  . Smokeless tobacco: Never Used  Vaping Use  . Vaping Use: Never used  Substance and Sexual Activity  . Alcohol use: Yes    Alcohol/week: 3.0 standard drinks    Types: 3 Cans of beer per week    Comment: On Weekends  . Drug use: Never  . Sexual activity: Yes  Other Topics Concern  . Not on file  Social History Narrative  . Not on file   Social Determinants of Health   Financial Resource Strain: Not on file  Food Insecurity: Not on file  Transportation Needs: Not on file  Physical  Activity: Not on file  Stress: Not on file  Social Connections: Not on file  Intimate Partner Violence: Not on file    Outpatient Medications Prior to Visit  Medication Sig Dispense Refill  . benazepril (LOTENSIN) 40 MG tablet Take 1 tablet (40 mg total) by mouth daily. 90 tablet 3  . hydrochlorothiazide (HYDRODIURIL) 25 MG tablet Take 1 tablet (25 mg total) by mouth daily. 90 tablet 1   No facility-administered medications prior to visit.    Allergies  Allergen Reactions  . Amlodipine Swelling    Gums swell    ROS Review of Systems  Constitutional: Negative.   Respiratory: Negative.   Cardiovascular: Negative.   Gastrointestinal: Negative.   Neurological: Negative.       Objective:    Physical Exam Vitals and nursing note reviewed.  Constitutional:      General: She is not in acute distress.    Appearance: Normal appearance.  HENT:     Head: Normocephalic and atraumatic.  Eyes:     Conjunctiva/sclera: Conjunctivae normal.  Cardiovascular:     Rate and Rhythm: Normal rate and regular rhythm.     Pulses: Normal pulses.     Heart sounds: Normal heart sounds.  Pulmonary:     Effort: Pulmonary effort is normal.     Breath sounds: Normal breath sounds.  Musculoskeletal:     Cervical back: Normal  range of motion.     Right lower leg: No edema.     Left lower leg: No edema.  Skin:    General: Skin is warm and dry.  Neurological:     General: No focal deficit present.     Mental Status: She is alert and oriented to person, place, and time.  Psychiatric:        Mood and Affect: Mood normal.        Behavior: Behavior normal.        Thought Content: Thought content normal.        Judgment: Judgment normal.     BP 128/84 (BP Location: Left Arm, Patient Position: Sitting)   Pulse 76   Temp 98.2 F (36.8 C) (Oral)   Wt 187 lb 6.4 oz (85 kg)   SpO2 97%   BMI 32.07 kg/m  Wt Readings from Last 3 Encounters:  05/05/20 187 lb 6.4 oz (85 kg)  01/12/20 183 lb  12.8 oz (83.4 kg)  12/16/19 183 lb 9.6 oz (83.3 kg)     Health Maintenance Due  Topic Date Due  . COLONOSCOPY (Pts 45-57yr Insurance coverage will need to be confirmed)  Never done  . MAMMOGRAM  Never done  . COVID-19 Vaccine (3 - Booster for Moderna series) 03/18/2020    There are no preventive care reminders to display for this patient.  Lab Results  Component Value Date   TSH 2.670 06/16/2019   Lab Results  Component Value Date   WBC 4.7 06/16/2019   HGB 11.3 06/16/2019   HCT 35.0 06/16/2019   MCV 91 06/16/2019   PLT 358 06/16/2019   Lab Results  Component Value Date   NA 140 03/06/2020   K 4.1 03/06/2020   CO2 21 03/06/2020   GLUCOSE 108 (H) 03/06/2020   BUN 22 03/06/2020   CREATININE 1.22 (H) 03/06/2020   BILITOT 0.2 06/16/2019   ALKPHOS 40 (L) 06/16/2019   AST 26 06/16/2019   ALT 20 06/16/2019   PROT 8.0 06/16/2019   ALBUMIN 4.8 06/16/2019   CALCIUM 10.4 (H) 03/06/2020   Lab Results  Component Value Date   CHOL 257 (H) 06/16/2019   Lab Results  Component Value Date   HDL 116 06/16/2019   Lab Results  Component Value Date   LDLCALC 130 (H) 06/16/2019   Lab Results  Component Value Date   TRIG 71 06/16/2019   No results found for: CHOLHDL No results found for: HGBA1C    Assessment & Plan:   Problem List Items Addressed This Visit      Cardiovascular and Mediastinum   Essential hypertension - Primary    Chronic, stable. Continue current regimen. Check CMP and CBC today. Follow-up in 6 months for a physical.       Relevant Orders   Comp Met (CMET)   CBC with Differential      No orders of the defined types were placed in this encounter.   Follow-up: Return in about 6 months (around 11/04/2020) for physical.    LCharyl Dancer NP

## 2020-05-05 NOTE — Assessment & Plan Note (Signed)
Chronic, stable. Continue current regimen. Check CMP and CBC today. Follow-up in 6 months for a physical.

## 2020-05-05 NOTE — Patient Instructions (Signed)
It was great to see you!  Continue your current medicines. We will recheck your kidneys and blood count today.  Let's follow-up in 6 months, sooner if you have concerns.  If a referral was placed today, you will be contacted for an appointment. Please note that routine referrals can sometimes take up to 3-4 weeks to process. Please call our office if you haven't heard anything after this time frame.  Take care,  Rodman Pickle, NP

## 2020-05-06 LAB — CBC WITH DIFFERENTIAL/PLATELET
Basophils Absolute: 0 10*3/uL (ref 0.0–0.2)
Basos: 1 %
EOS (ABSOLUTE): 0.1 10*3/uL (ref 0.0–0.4)
Eos: 3 %
Hematocrit: 33.9 % — ABNORMAL LOW (ref 34.0–46.6)
Hemoglobin: 11 g/dL — ABNORMAL LOW (ref 11.1–15.9)
Immature Grans (Abs): 0 10*3/uL (ref 0.0–0.1)
Immature Granulocytes: 0 %
Lymphocytes Absolute: 1.6 10*3/uL (ref 0.7–3.1)
Lymphs: 40 %
MCH: 29.6 pg (ref 26.6–33.0)
MCHC: 32.4 g/dL (ref 31.5–35.7)
MCV: 91 fL (ref 79–97)
Monocytes Absolute: 0.7 10*3/uL (ref 0.1–0.9)
Monocytes: 16 %
Neutrophils Absolute: 1.6 10*3/uL (ref 1.4–7.0)
Neutrophils: 40 %
Platelets: 290 10*3/uL (ref 150–450)
RBC: 3.72 x10E6/uL — ABNORMAL LOW (ref 3.77–5.28)
RDW: 14.5 % (ref 11.7–15.4)
WBC: 4.1 10*3/uL (ref 3.4–10.8)

## 2020-05-06 LAB — COMPREHENSIVE METABOLIC PANEL
ALT: 19 IU/L (ref 0–32)
AST: 21 IU/L (ref 0–40)
Albumin/Globulin Ratio: 1.7 (ref 1.2–2.2)
Albumin: 4.7 g/dL (ref 3.8–4.9)
Alkaline Phosphatase: 37 IU/L — ABNORMAL LOW (ref 44–121)
BUN/Creatinine Ratio: 27 — ABNORMAL HIGH (ref 9–23)
BUN: 32 mg/dL — ABNORMAL HIGH (ref 6–24)
Bilirubin Total: <0.2 mg/dL (ref 0.0–1.2)
CO2: 20 mmol/L (ref 20–29)
Calcium: 10 mg/dL (ref 8.7–10.2)
Chloride: 100 mmol/L (ref 96–106)
Creatinine, Ser: 1.2 mg/dL — ABNORMAL HIGH (ref 0.57–1.00)
Globulin, Total: 2.8 g/dL (ref 1.5–4.5)
Glucose: 91 mg/dL (ref 65–99)
Potassium: 4.8 mmol/L (ref 3.5–5.2)
Sodium: 137 mmol/L (ref 134–144)
Total Protein: 7.5 g/dL (ref 6.0–8.5)
eGFR: 53 mL/min/{1.73_m2} — ABNORMAL LOW (ref >59)

## 2020-07-09 ENCOUNTER — Other Ambulatory Visit: Payer: Self-pay | Admitting: Family Medicine

## 2020-07-09 NOTE — Telephone Encounter (Signed)
Requested Prescriptions  Pending Prescriptions Disp Refills  . hydrochlorothiazide (HYDRODIURIL) 25 MG tablet [Pharmacy Med Name: HYDROCHLOROTHIAZIDE 25 MG TAB] 90 tablet 1    Sig: TAKE 1 TABLET (25 MG TOTAL) BY MOUTH DAILY.     Cardiovascular: Diuretics - Thiazide Failed - 07/09/2020  9:01 AM      Failed - Cr in normal range and within 360 days    Creatinine, Ser  Date Value Ref Range Status  05/05/2020 1.20 (H) 0.57 - 1.00 mg/dL Final         Passed - Ca in normal range and within 360 days    Calcium  Date Value Ref Range Status  05/05/2020 10.0 8.7 - 10.2 mg/dL Final         Passed - K in normal range and within 360 days    Potassium  Date Value Ref Range Status  05/05/2020 4.8 3.5 - 5.2 mmol/L Final         Passed - Na in normal range and within 360 days    Sodium  Date Value Ref Range Status  05/05/2020 137 134 - 144 mmol/L Final         Passed - Last BP in normal range    BP Readings from Last 1 Encounters:  05/05/20 128/84         Passed - Valid encounter within last 6 months    Recent Outpatient Visits          2 months ago Essential hypertension   [facility] [place], Jake Church, NP   5 months ago Essential hypertension   [facility] Caro Laroche, DO   5 months ago Essential hypertension   [facility] [place], [place], DO   6 months ago Essential hypertension   [facility] [place], [place], DO   1 year ago Essential hypertension   [facility] [place], Salley Hews, [place]      Future Appointments            In 4 months Larae Grooms, NP [facility], [facility]

## 2020-11-09 ENCOUNTER — Other Ambulatory Visit (HOSPITAL_COMMUNITY)
Admission: RE | Admit: 2020-11-09 | Discharge: 2020-11-09 | Disposition: A | Discharge status: Home / Self Care | Payer: BC Managed Care – PPO | Source: Ambulatory Visit | Attending: Nurse Practitioner | Admitting: Nurse Practitioner

## 2020-11-09 DIAGNOSIS — Z0000 Encounter for general adult medical examination without abnormal findings: Secondary | ICD-10-CM | POA: Insufficient documentation

## 2020-11-09 NOTE — Progress Notes (Signed)
BP (!) 159/94   Pulse 78   Ht 5' 4.09" (1.628 m)   Wt 184 lb 3.2 oz (83.6 kg)   SpO2 97%   BMI 31.53 kg/m    Subjective:    Patient ID: Nicole Cortez, female    DOB: May 30, 1965, 55 y.o.   MRN: [id number]  HPI: Nicole Cortez is a 55 y.o. female presenting on 11/10/2020 for comprehensive medical examination. Current medical complaints include:none  She currently lives with: Menopausal Symptoms: no  HYPERTENSION Hypertension status: uncontrolled  Satisfied with current treatment? no Duration of hypertension: years BP monitoring frequency:  not checking BP range:  BP medication side effects:  no Medication compliance: excellent compliance Previous BP meds:benazepril/HCTZ Aspirin: no Recurrent headaches: no Visual changes: no Palpitations: no Dyspnea: no Chest pain: no Lower extremity edema: no Dizzy/lightheaded: no  Depression Screen done today and results listed below:  Depression screen [facility] 2/9 06/16/2019 12/19/2017  Decreased Interest 0 0  Down, Depressed, Hopeless 0 0  PHQ - 2 Score 0 0  Altered sleeping 3 0  Tired, decreased energy 1 1  Change in appetite 1 1  Feeling bad or failure about yourself  0 0  Trouble concentrating 0 0  Moving slowly or fidgety/restless 0 0  Suicidal thoughts 0 0  PHQ-9 Score 5 2  Difficult doing work/chores - Not difficult at all    The patient does not have a history of falls. I did complete a risk assessment for falls. A plan of care for falls was documented.   Past Medical History:  Past Medical History:  Diagnosis Date   Hypertension     Surgical History:  Past Surgical History:  Procedure Laterality Date   TUBAL LIGATION      Medications:  No current outpatient medications on file prior to visit.   No current facility-administered medications on file prior to visit.    Allergies:  Allergies  Allergen Reactions   Amlodipine Swelling    Gums swell    Social History:  Social History   Socioeconomic  History   Marital status: Single    Spouse name: Not on file   Number of children: Not on file   Years of education: Not on file   Highest education level: Not on file  Occupational History   Not on file  Tobacco Use   Smoking status: Never   Smokeless tobacco: Never  Vaping Use   Vaping Use: Never used  Substance and Sexual Activity   Alcohol use: Yes    Alcohol/week: 3.0 standard drinks    Types: 3 Cans of beer per week    Comment: On Weekends   Drug use: Never   Sexual activity: Yes  Other Topics Concern   Not on file  Social History Narrative   Not on file   Social Determinants of Health   Financial Resource Strain: Not on file  Food Insecurity: Not on file  Transportation Needs: Not on file  Physical Activity: Not on file  Stress: Not on file  Social Connections: Not on file  Intimate Partner Violence: Not on file   Social History   Tobacco Use  Smoking Status Never  Smokeless Tobacco Never   Social History   Substance and Sexual Activity  Alcohol Use Yes   Alcohol/week: 3.0 standard drinks   Types: 3 Cans of beer per week   Comment: On Weekends    Family History:  Family History  Problem Relation Age of Onset   Hypertension  Mother    Kidney disease Mother    Hypertension Maternal Aunt    Kidney disease Maternal Aunt    Hypertension Maternal Uncle    Kidney disease Maternal Uncle    Kidney disease Maternal Grandmother     Past medical history, surgical history, medications, allergies, family history and social history reviewed with patient today and changes made to appropriate areas of the chart.   Review of Systems  Eyes:  Negative for blurred vision and double vision.  Respiratory:  Negative for shortness of breath.   Cardiovascular:  Negative for chest pain, palpitations and leg swelling.  Neurological:  Negative for dizziness and headaches.  All other ROS negative except what is listed above and in the HPI.      Objective:    BP (!)  159/94   Pulse 78   Ht 5' 4.09" (1.628 m)   Wt 184 lb 3.2 oz (83.6 kg)   SpO2 97%   BMI 31.53 kg/m   Wt Readings from Last 3 Encounters:  11/10/20 184 lb 3.2 oz (83.6 kg)  05/05/20 187 lb 6.4 oz (85 kg)  01/12/20 183 lb 12.8 oz (83.4 kg)    Physical Exam Vitals and nursing note reviewed. Exam conducted with a chaperone present Loni Muse, CMA).  Constitutional:      General: She is awake. She is not in acute distress.    Appearance: She is well-developed. She is not ill-appearing.  HENT:     Head: Normocephalic and atraumatic.     Right Ear: Hearing, tympanic membrane, ear canal and external ear normal. No drainage.     Left Ear: Hearing, tympanic membrane, ear canal and external ear normal. No drainage.     Nose: Nose normal.     Right Sinus: No maxillary sinus tenderness or frontal sinus tenderness.     Left Sinus: No maxillary sinus tenderness or frontal sinus tenderness.     Mouth/Throat:     Mouth: Mucous membranes are moist.     Pharynx: Oropharynx is clear. Uvula midline. No pharyngeal swelling, oropharyngeal exudate or posterior oropharyngeal erythema.  Eyes:     General: Lids are normal.        Right eye: No discharge.        Left eye: No discharge.     Extraocular Movements: Extraocular movements intact.     Conjunctiva/sclera: Conjunctivae normal.     Pupils: Pupils are equal, round, and reactive to light.     Visual Fields: Right eye visual fields normal and left eye visual fields normal.  Neck:     Thyroid: No thyromegaly.     Vascular: No carotid bruit.     Trachea: Trachea normal.  Cardiovascular:     Rate and Rhythm: Normal rate and regular rhythm.     Heart sounds: Normal heart sounds. No murmur heard.   No gallop.  Pulmonary:     Effort: Pulmonary effort is normal. No accessory muscle usage or respiratory distress.     Breath sounds: Normal breath sounds.  Chest:  Breasts:    Right: Normal.     Left: Normal.  Abdominal:     General: Bowel  sounds are normal.     Palpations: Abdomen is soft. There is no hepatomegaly or splenomegaly.     Tenderness: There is no abdominal tenderness.  Genitourinary:    Vagina: Normal.     Cervix: Normal.     Adnexa: Right adnexa normal and left adnexa normal.  Musculoskeletal:  General: Normal range of motion.     Cervical back: Normal range of motion and neck supple.     Right lower leg: No edema.     Left lower leg: No edema.  Lymphadenopathy:     Head:     Right side of head: No submental, submandibular, tonsillar, preauricular or posterior auricular adenopathy.     Left side of head: No submental, submandibular, tonsillar, preauricular or posterior auricular adenopathy.     Cervical: No cervical adenopathy.     Upper Body:     Right upper body: No supraclavicular, axillary or pectoral adenopathy.     Left upper body: No supraclavicular, axillary or pectoral adenopathy.  Skin:    General: Skin is warm and dry.     Capillary Refill: Capillary refill takes less than 2 seconds.     Findings: No rash.  Neurological:     Mental Status: She is alert and oriented to person, place, and time.     Gait: Gait is intact.     Deep Tendon Reflexes: Reflexes are normal and symmetric.     Reflex Scores:      Brachioradialis reflexes are 2+ on the right side and 2+ on the left side.      Patellar reflexes are 2+ on the right side and 2+ on the left side. Psychiatric:        Attention and Perception: Attention normal.        Mood and Affect: Mood normal.        Speech: Speech normal.        Behavior: Behavior normal. Behavior is cooperative.        Thought Content: Thought content normal.        Judgment: Judgment normal.    Results for orders placed or performed in visit on 05/05/20  Comp Met (CMET)  Result Value Ref Range   Glucose 91 65 - 99 mg/dL   BUN 32 (H) 6 - 24 mg/dL   Creatinine, Ser 1.20 (H) 0.57 - 1.00 mg/dL   eGFR 53 (L) >59 mL/min/1.73   BUN/Creatinine Ratio 27 (H) 9 -  23   Sodium 137 134 - 144 mmol/L   Potassium 4.8 3.5 - 5.2 mmol/L   Chloride 100 96 - 106 mmol/L   CO2 20 20 - 29 mmol/L   Calcium 10.0 8.7 - 10.2 mg/dL   Total Protein 7.5 6.0 - 8.5 g/dL   Albumin 4.7 3.8 - 4.9 g/dL   Globulin, Total 2.8 1.5 - 4.5 g/dL   Albumin/Globulin Ratio 1.7 1.2 - 2.2   Bilirubin Total <0.2 0.0 - 1.2 mg/dL   Alkaline Phosphatase 37 (L) 44 - 121 IU/L   AST 21 0 - 40 IU/L   ALT 19 0 - 32 IU/L  CBC with Differential  Result Value Ref Range   WBC 4.1 3.4 - 10.8 x10E3/uL   RBC 3.72 (L) 3.77 - 5.28 x10E6/uL   Hemoglobin 11.0 (L) 11.1 - 15.9 g/dL   Hematocrit 33.9 (L) 34.0 - 46.6 %   MCV 91 79 - 97 fL   MCH 29.6 26.6 - 33.0 pg   MCHC 32.4 31.5 - 35.7 g/dL   RDW 14.5 11.7 - 15.4 %   Platelets 290 150 - 450 x10E3/uL   Neutrophils 40 Not Estab. %   Lymphs 40 Not Estab. %   Monocytes 16 Not Estab. %   Eos 3 Not Estab. %   Basos 1 Not Estab. %   Neutrophils Absolute 1.6 1.4 -  7.0 x10E3/uL   Lymphocytes Absolute 1.6 0.7 - 3.1 x10E3/uL   Monocytes Absolute 0.7 0.1 - 0.9 x10E3/uL   EOS (ABSOLUTE) 0.1 0.0 - 0.4 x10E3/uL   Basophils Absolute 0.0 0.0 - 0.2 x10E3/uL   Immature Granulocytes 0 Not Estab. %   Immature Grans (Abs) 0.0 0.0 - 0.1 x10E3/uL      Assessment & Plan:   Problem List Items Addressed This Visit       Cardiovascular and Mediastinum   Essential hypertension    Chronic.  Uncontrolled.  Will add Hydralazine 14m BID.  Continue to take Benzapril 427mand HCTZ 2580maily.  Patient has allergy to Amlodipine.  Labs ordered today.  Return to clinic in 1 months for reevaluation.  Call sooner if concerns arise.        Relevant Medications   benazepril (LOTENSIN) 40 MG tablet   hydrochlorothiazide (HYDRODIURIL) 25 MG tablet   hydrALAZINE (APRESOLINE) 10 MG tablet   Other Visit Diagnoses     Annual physical exam    -  Primary   Health maintenance reviewed during visit today. Labs ordered. Refused Flu and shingrix.  Mammogram and Cologaurd ordered.  PAP done.   Relevant Orders   CBC with Differential/Platelet   Comprehensive metabolic panel   Lipid panel   TSH   Urinalysis, Routine w reflex microscopic   Cytology - PAP   Encounter for screening mammogram for malignant neoplasm of breast       Relevant Orders   MM Digital Screening   Screening for colon cancer       Relevant Orders   Cologuard        Follow up plan: Return in about 1 month (around 12/11/2020) for BP Check.   LABORATORY TESTING:  - Pap smear: pap done  IMMUNIZATIONS:   - Tdap: Tetanus vaccination status reviewed: last tetanus booster within 10 years. - Influenza: Refused - Pneumovax: Not applicable - Prevnar: Not applicable - HPV: Not applicable - Zostavax vaccine: Refused  SCREENING: -Mammogram: Ordered today  - Colonoscopy:  Cologuard ordered today.   - Bone Density: Not applicable  -Hearing Test: Not applicable  -Spirometry: Not applicable   PATIENT COUNSELING:   Advised to take 1 mg of folate supplement per day if capable of pregnancy.   Sexuality: Discussed sexually transmitted diseases, partner selection, use of condoms, avoidance of unintended pregnancy  and contraceptive alternatives.   Advised to avoid cigarette smoking.  I discussed with the patient that most people either abstain from alcohol or drink within safe limits (<=14/week and <=4 drinks/occasion for males, <=7/weeks and <= 3 drinks/occasion for females) and that the risk for alcohol disorders and other health effects rises proportionally with the number of drinks per week and how often a drinker exceeds daily limits.  Discussed cessation/primary prevention of drug use and availability of treatment for abuse.   Diet: Encouraged to adjust caloric intake to maintain  or achieve ideal body weight, to reduce intake of dietary saturated fat and total fat, to limit sodium intake by avoiding high sodium foods and not adding table salt, and to maintain adequate dietary potassium and  calcium preferably from fresh fruits, vegetables, and low-fat dairy products.    stressed the importance of regular exercise  Injury prevention: Discussed safety belts, safety helmets, smoke detector, smoking near bedding or upholstery.   Dental health: Discussed importance of regular tooth brushing, flossing, and dental visits.    NEXT PREVENTATIVE PHYSICAL DUE IN 1 YEAR. Return in about 1 month (around  12/11/2020) for BP Check.

## 2020-11-10 ENCOUNTER — Other Ambulatory Visit: Payer: Self-pay

## 2020-11-10 ENCOUNTER — Encounter: Payer: Self-pay | Admitting: Nurse Practitioner

## 2020-11-10 ENCOUNTER — Ambulatory Visit (INDEPENDENT_AMBULATORY_CARE_PROVIDER_SITE_OTHER): Payer: BC Managed Care – PPO | Admitting: Nurse Practitioner

## 2020-11-10 VITALS — BP 159/94 | HR 78 | Ht 64.09 in | Wt 184.2 lb

## 2020-11-10 DIAGNOSIS — Z0000 Encounter for general adult medical examination without abnormal findings: Secondary | ICD-10-CM | POA: Diagnosis not present

## 2020-11-10 DIAGNOSIS — Z136 Encounter for screening for cardiovascular disorders: Secondary | ICD-10-CM | POA: Diagnosis not present

## 2020-11-10 DIAGNOSIS — Z1231 Encounter for screening mammogram for malignant neoplasm of breast: Secondary | ICD-10-CM

## 2020-11-10 DIAGNOSIS — Z1211 Encounter for screening for malignant neoplasm of colon: Secondary | ICD-10-CM | POA: Diagnosis not present

## 2020-11-10 DIAGNOSIS — I10 Essential (primary) hypertension: Secondary | ICD-10-CM | POA: Diagnosis not present

## 2020-11-10 LAB — URINALYSIS, ROUTINE W REFLEX MICROSCOPIC
Bilirubin, UA: NEGATIVE
Glucose, UA: NEGATIVE
Ketones, UA: NEGATIVE
Leukocytes,UA: NEGATIVE
Nitrite, UA: NEGATIVE
Protein,UA: NEGATIVE
RBC, UA: NEGATIVE
Specific Gravity, UA: 1.025 (ref 1.005–1.030)
Urobilinogen, Ur: 0.2 mg/dL (ref 0.2–1.0)
pH, UA: 5 (ref 5.0–7.5)

## 2020-11-10 MED ORDER — HYDRALAZINE HCL 10 MG PO TABS: 10.0000 mg | ORAL_TABLET | Freq: Two times a day (BID) | ORAL | 0 refills | Status: DC

## 2020-11-10 MED ORDER — HYDROCHLOROTHIAZIDE 25 MG PO TABS: 25.0000 mg | ORAL_TABLET | Freq: Every day | ORAL | 1 refills | Status: DC

## 2020-11-10 MED ORDER — BENAZEPRIL HCL 40 MG PO TABS: 40.0000 mg | ORAL_TABLET | Freq: Every day | ORAL | 1 refills | Status: DC

## 2020-11-10 NOTE — Assessment & Plan Note (Signed)
Chronic.  Uncontrolled.  Will add Hydralazine 10mg  BID.  Continue to take Benzapril 40mg  and HCTZ 25mg  daily.  Patient has allergy to Amlodipine.  Labs ordered today.  Return to clinic in 1 months for reevaluation.  Call sooner if concerns arise.

## 2020-11-11 LAB — LIPID PANEL
Chol/HDL Ratio: 2.8 ratio (ref 0.0–4.4)
Cholesterol, Total: 278 mg/dL — ABNORMAL HIGH (ref 100–199)
HDL: 100 mg/dL (ref >39)
LDL Chol Calc (NIH): 159 mg/dL — ABNORMAL HIGH (ref 0–99)
Triglycerides: 112 mg/dL (ref 0–149)
VLDL Cholesterol Cal: 19 mg/dL (ref 5–40)

## 2020-11-11 LAB — CBC WITH DIFFERENTIAL/PLATELET
Basophils Absolute: 0 10*3/uL (ref 0.0–0.2)
Basos: 1 %
EOS (ABSOLUTE): 0.2 10*3/uL (ref 0.0–0.4)
Eos: 3 %
Hematocrit: 41.4 % (ref 34.0–46.6)
Hemoglobin: 13.2 g/dL (ref 11.1–15.9)
Immature Grans (Abs): 0 10*3/uL (ref 0.0–0.1)
Immature Granulocytes: 0 %
Lymphocytes Absolute: 1.8 10*3/uL (ref 0.7–3.1)
Lymphs: 41 %
MCH: 29.9 pg (ref 26.6–33.0)
MCHC: 31.9 g/dL (ref 31.5–35.7)
MCV: 94 fL (ref 79–97)
Monocytes Absolute: 0.5 10*3/uL (ref 0.1–0.9)
Monocytes: 12 %
Neutrophils Absolute: 1.9 10*3/uL (ref 1.4–7.0)
Neutrophils: 43 %
Platelets: 359 10*3/uL (ref 150–450)
RBC: 4.41 x10E6/uL (ref 3.77–5.28)
RDW: 13.5 % (ref 11.7–15.4)
WBC: 4.4 10*3/uL (ref 3.4–10.8)

## 2020-11-11 LAB — COMPREHENSIVE METABOLIC PANEL
ALT: 21 IU/L (ref 0–32)
AST: 27 IU/L (ref 0–40)
Albumin/Globulin Ratio: 1.7 (ref 1.2–2.2)
Albumin: 5.2 g/dL — ABNORMAL HIGH (ref 3.8–4.9)
Alkaline Phosphatase: 44 IU/L (ref 44–121)
BUN/Creatinine Ratio: 21 (ref 9–23)
BUN: 22 mg/dL (ref 6–24)
Bilirubin Total: 0.2 mg/dL (ref 0.0–1.2)
CO2: 24 mmol/L (ref 20–29)
Calcium: 10.2 mg/dL (ref 8.7–10.2)
Chloride: 100 mmol/L (ref 96–106)
Creatinine, Ser: 1.03 mg/dL — ABNORMAL HIGH (ref 0.57–1.00)
Globulin, Total: 3.1 g/dL (ref 1.5–4.5)
Glucose: 91 mg/dL (ref 70–99)
Potassium: 4 mmol/L (ref 3.5–5.2)
Sodium: 139 mmol/L (ref 134–144)
Total Protein: 8.3 g/dL (ref 6.0–8.5)
eGFR: 64 mL/min/{1.73_m2} (ref >59)

## 2020-11-11 LAB — TSH: TSH: 1.08 u[IU]/mL (ref 0.450–4.500)

## 2020-11-13 MED ORDER — ROSUVASTATIN CALCIUM 5 MG PO TABS: 5.0000 mg | ORAL_TABLET | Freq: Every day | ORAL | 1 refills | Status: DC

## 2020-11-13 NOTE — Progress Notes (Signed)
Medication sent to the pharmacy.

## 2020-11-13 NOTE — Addendum Note (Signed)
Addended by: Larae Grooms on: 11/13/2020 11:51 AM   Modules accepted: Orders

## 2020-11-13 NOTE — Progress Notes (Signed)
Please let patient know that her kidneys look like she is dehydrated.  I recommend she increase her water intake. Cholesterol is elevated. I recommend she start Crestor 5mg  daily.  The goal will be to increase it to 20mg  daily.  If she agrees, I can send this to the pharmacy. Otherwise labs look good.  We will continue to monitor at future visits.

## 2020-11-14 LAB — CYTOLOGY - PAP
Adequacy: ABSENT
Diagnosis: NEGATIVE

## 2020-11-14 NOTE — Progress Notes (Signed)
Please let patient know that her PAP was normal. We will repeat it in 5 years.

## 2020-12-07 ENCOUNTER — Other Ambulatory Visit: Payer: Self-pay | Admitting: Nurse Practitioner

## 2020-12-08 NOTE — Telephone Encounter (Signed)
Requested Prescriptions  Pending Prescriptions Disp Refills  . hydrALAZINE (APRESOLINE) 10 MG tablet [Pharmacy Med Name: HYDRALAZINE 10 MG TABLET] 60 tablet 0    Sig: TAKE 1 TABLET (10 MG TOTAL) BY MOUTH IN THE MORNING AND AT BEDTIME.     Cardiovascular:  Vasodilators Failed - 12/07/2020  2:31 PM      Failed - Last BP in normal range    BP Readings from Last 1 Encounters:  11/10/20 (!) 159/94         Passed - HCT in normal range and within 360 days    Hematocrit  Date Value Ref Range Status  11/10/2020 41.4 34.0 - 46.6 % Final         Passed - HGB in normal range and within 360 days    Hemoglobin  Date Value Ref Range Status  11/10/2020 13.2 11.1 - 15.9 g/dL Final         Passed - RBC in normal range and within 360 days    RBC  Date Value Ref Range Status  11/10/2020 4.41 3.77 - 5.28 x10E6/uL Final         Passed - WBC in normal range and within 360 days    WBC  Date Value Ref Range Status  11/10/2020 4.4 3.4 - 10.8 x10E3/uL Final         Passed - PLT in normal range and within 360 days    Platelets  Date Value Ref Range Status  11/10/2020 359 150 - 450 x10E3/uL Final         Passed - Valid encounter within last 12 months    Recent Outpatient Visits          4 weeks ago Annual physical exam   [facility] Larae Grooms, NP   7 months ago Essential hypertension   [facility] McElwee, Jake Church, NP   10 months ago Essential hypertension   [facility] Caro Laroche, DO   11 months ago Essential hypertension   [facility], [place], DO   11 months ago Essential hypertension   [facility], Oralia Rud, DO      Future Appointments            In 3 days Larae Grooms, NP [facility]

## 2020-12-11 ENCOUNTER — Encounter: Payer: Self-pay | Admitting: Nurse Practitioner

## 2020-12-11 ENCOUNTER — Other Ambulatory Visit: Payer: Self-pay

## 2020-12-11 ENCOUNTER — Ambulatory Visit (INDEPENDENT_AMBULATORY_CARE_PROVIDER_SITE_OTHER): Payer: BC Managed Care – PPO | Admitting: Nurse Practitioner

## 2020-12-11 VITALS — BP 137/86 | HR 74 | Temp 98.3°F | Wt 187.8 lb

## 2020-12-11 DIAGNOSIS — E782 Mixed hyperlipidemia: Secondary | ICD-10-CM | POA: Diagnosis not present

## 2020-12-11 DIAGNOSIS — I10 Essential (primary) hypertension: Secondary | ICD-10-CM | POA: Diagnosis not present

## 2020-12-11 MED ORDER — ROSUVASTATIN CALCIUM 10 MG PO TABS: 10.0000 mg | ORAL_TABLET | Freq: Every day | ORAL | 1 refills | Status: DC

## 2020-12-11 MED ORDER — HYDRALAZINE HCL 10 MG PO TABS: 10.0000 mg | ORAL_TABLET | Freq: Two times a day (BID) | ORAL | 1 refills | Status: DC

## 2020-12-11 NOTE — Assessment & Plan Note (Signed)
Chronic.  Controlled.  Continue with current medication regimen on hydralazine 10mg  BID, Benzapril 40mg  daily, HCTZ 25mg  daily.  Refill of hydralazine sent in.  Return to clinic in 3 months for reevaluation.  Call sooner if concerns arise.

## 2020-12-11 NOTE — Assessment & Plan Note (Signed)
Chronic. Will increase Crestor to 10mg  daily. Patient tolerating 5mg  well.  Medication sent into the pharmacy.

## 2020-12-11 NOTE — Progress Notes (Signed)
BP 137/86 (BP Location: Left Arm, Cuff Size: Normal)   Pulse 74   Temp 98.3 F (36.8 C) (Oral)   Wt 187 lb 12.8 oz (85.2 kg)   SpO2 98%   BMI 32.15 kg/m    Subjective:    Patient ID: Nicole Cortez, female    DOB: 12/30/1965, 55 y.o.   MRN: [id number]  HPI: Nicole Cortez is a 55 y.o. female  Chief Complaint  Patient presents with   Hypertension    1 month f/up   HYPERTENSION Hypertension status: controlled  Satisfied with current treatment? yes Duration of hypertension: years BP monitoring frequency:  daily BP range: 132-82 BP medication side effects:  no Medication compliance: excellent compliance Previous BP meds:benazepril and HCTZ, hydralazine Aspirin: no Recurrent headaches: no Visual changes: no Palpitations: no Dyspnea: no Chest pain: no Lower extremity edema: no Dizzy/lightheaded: no  Relevant past medical, surgical, family and social history reviewed and updated as indicated. Interim medical history since our last visit reviewed. Allergies and medications reviewed and updated.  Review of Systems  Eyes:  Negative for visual disturbance.  Respiratory:  Negative for cough, chest tightness and shortness of breath.   Cardiovascular:  Negative for chest pain, palpitations and leg swelling.  Neurological:  Negative for dizziness and headaches.   Per HPI unless specifically indicated above     Objective:    BP 137/86 (BP Location: Left Arm, Cuff Size: Normal)   Pulse 74   Temp 98.3 F (36.8 C) (Oral)   Wt 187 lb 12.8 oz (85.2 kg)   SpO2 98%   BMI 32.15 kg/m   Wt Readings from Last 3 Encounters:  12/11/20 187 lb 12.8 oz (85.2 kg)  11/10/20 184 lb 3.2 oz (83.6 kg)  05/05/20 187 lb 6.4 oz (85 kg)    Physical Exam Vitals and nursing note reviewed.  Constitutional:      General: She is not in acute distress.    Appearance: Normal appearance. She is normal weight. She is not ill-appearing, toxic-appearing or diaphoretic.  HENT:     Head:  Normocephalic.     Right Ear: External ear normal.     Left Ear: External ear normal.     Nose: Nose normal.     Mouth/Throat:     Mouth: Mucous membranes are moist.     Pharynx: Oropharynx is clear.  Eyes:     General:        Right eye: No discharge.        Left eye: No discharge.     Extraocular Movements: Extraocular movements intact.     Conjunctiva/sclera: Conjunctivae normal.     Pupils: Pupils are equal, round, and reactive to light.  Cardiovascular:     Rate and Rhythm: Normal rate and regular rhythm.     Heart sounds: No murmur heard. Pulmonary:     Effort: Pulmonary effort is normal. No respiratory distress.     Breath sounds: Normal breath sounds. No wheezing or rales.  Musculoskeletal:     Cervical back: Normal range of motion and neck supple.  Skin:    General: Skin is warm and dry.     Capillary Refill: Capillary refill takes less than 2 seconds.  Neurological:     General: No focal deficit present.     Mental Status: She is alert and oriented to person, place, and time. Mental status is at baseline.  Psychiatric:        Mood and Affect: Mood normal.  Behavior: Behavior normal.        Thought Content: Thought content normal.        Judgment: Judgment normal.    Results for orders placed or performed in visit on 11/10/20  CBC with Differential/Platelet  Result Value Ref Range   WBC 4.4 3.4 - 10.8 x10E3/uL   RBC 4.41 3.77 - 5.28 x10E6/uL   Hemoglobin 13.2 11.1 - 15.9 g/dL   Hematocrit 41.4 34.0 - 46.6 %   MCV 94 79 - 97 fL   MCH 29.9 26.6 - 33.0 pg   MCHC 31.9 31.5 - 35.7 g/dL   RDW 13.5 11.7 - 15.4 %   Platelets 359 150 - 450 x10E3/uL   Neutrophils 43 Not Estab. %   Lymphs 41 Not Estab. %   Monocytes 12 Not Estab. %   Eos 3 Not Estab. %   Basos 1 Not Estab. %   Neutrophils Absolute 1.9 1.4 - 7.0 x10E3/uL   Lymphocytes Absolute 1.8 0.7 - 3.1 x10E3/uL   Monocytes Absolute 0.5 0.1 - 0.9 x10E3/uL   EOS (ABSOLUTE) 0.2 0.0 - 0.4 x10E3/uL    Basophils Absolute 0.0 0.0 - 0.2 x10E3/uL   Immature Granulocytes 0 Not Estab. %   Immature Grans (Abs) 0.0 0.0 - 0.1 x10E3/uL  Comprehensive metabolic panel  Result Value Ref Range   Glucose 91 70 - 99 mg/dL   BUN 22 6 - 24 mg/dL   Creatinine, Ser 1.03 (H) 0.57 - 1.00 mg/dL   eGFR 64 >59 mL/min/1.73   BUN/Creatinine Ratio 21 9 - 23   Sodium 139 134 - 144 mmol/L   Potassium 4.0 3.5 - 5.2 mmol/L   Chloride 100 96 - 106 mmol/L   CO2 24 20 - 29 mmol/L   Calcium 10.2 8.7 - 10.2 mg/dL   Total Protein 8.3 6.0 - 8.5 g/dL   Albumin 5.2 (H) 3.8 - 4.9 g/dL   Globulin, Total 3.1 1.5 - 4.5 g/dL   Albumin/Globulin Ratio 1.7 1.2 - 2.2   Bilirubin Total 0.2 0.0 - 1.2 mg/dL   Alkaline Phosphatase 44 44 - 121 IU/L   AST 27 0 - 40 IU/L   ALT 21 0 - 32 IU/L  Lipid panel  Result Value Ref Range   Cholesterol, Total 278 (H) 100 - 199 mg/dL   Triglycerides 112 0 - 149 mg/dL   HDL 100 >39 mg/dL   VLDL Cholesterol Cal 19 5 - 40 mg/dL   LDL Chol Calc (NIH) 159 (H) 0 - 99 mg/dL   Chol/HDL Ratio 2.8 0.0 - 4.4 ratio  TSH  Result Value Ref Range   TSH 1.080 0.450 - 4.500 uIU/mL  Urinalysis, Routine w reflex microscopic  Result Value Ref Range   Specific Gravity, UA 1.025 1.005 - 1.030   pH, UA 5.0 5.0 - 7.5   Color, UA Yellow Yellow   Appearance Ur Clear Clear   Leukocytes,UA Negative Negative   Protein,UA Negative Negative/Trace   Glucose, UA Negative Negative   Ketones, UA Negative Negative   RBC, UA Negative Negative   Bilirubin, UA Negative Negative   Urobilinogen, Ur 0.2 0.2 - 1.0 mg/dL   Nitrite, UA Negative Negative  Cytology - PAP  Result Value Ref Range   Adequacy      Satisfactory for evaluation; transformation zone component ABSENT.   Diagnosis      - Negative for intraepithelial lesion or malignancy (NILM)      Assessment & Plan:   Problem List Items Addressed This Visit  Cardiovascular and Mediastinum   Essential hypertension - Primary    Chronic.  Controlled.   Continue with current medication regimen on hydralazine 34m BID, Benzapril 453mdaily, HCTZ 2537maily.  Refill of hydralazine sent in.  Return to clinic in 3 months for reevaluation.  Call sooner if concerns arise.        Relevant Medications   hydrALAZINE (APRESOLINE) 10 MG tablet   rosuvastatin (CRESTOR) 10 MG tablet     Other   Mixed hyperlipidemia    Chronic. Will increase Crestor to 72m77mily. Patient tolerating 5mg 54ml.  Medication sent into the pharmacy.       Relevant Medications   hydrALAZINE (APRESOLINE) 10 MG tablet   rosuvastatin (CRESTOR) 10 MG tablet     Follow up plan: Return in about 3 months (around 03/13/2021) for HTN, HLD, DM2 FU.

## 2021-01-30 ENCOUNTER — Other Ambulatory Visit: Payer: Self-pay

## 2021-01-30 DIAGNOSIS — Z1231 Encounter for screening mammogram for malignant neoplasm of breast: Secondary | ICD-10-CM

## 2021-03-06 ENCOUNTER — Ambulatory Visit
Admission: RE | Admit: 2021-03-06 | Discharge: 2021-03-06 | Disposition: A | Discharge status: Home / Self Care | Payer: BC Managed Care – PPO | Source: Ambulatory Visit | Attending: Nurse Practitioner | Admitting: Nurse Practitioner

## 2021-03-06 ENCOUNTER — Other Ambulatory Visit: Payer: Self-pay

## 2021-03-06 DIAGNOSIS — Z1231 Encounter for screening mammogram for malignant neoplasm of breast: Secondary | ICD-10-CM | POA: Insufficient documentation

## 2021-03-07 NOTE — Progress Notes (Signed)
Please let patient know her Mammogram did not show any evidence of a malignancy.  The recommendation is to repeat the Mammogram in 1 year.  

## 2021-03-13 ENCOUNTER — Other Ambulatory Visit: Payer: Self-pay

## 2021-03-13 ENCOUNTER — Encounter: Payer: Self-pay | Admitting: Nurse Practitioner

## 2021-03-13 ENCOUNTER — Ambulatory Visit (INDEPENDENT_AMBULATORY_CARE_PROVIDER_SITE_OTHER): Payer: BC Managed Care – PPO | Admitting: Nurse Practitioner

## 2021-03-13 VITALS — BP 156/79 | HR 73 | Temp 98.6°F | Wt 191.4 lb

## 2021-03-13 DIAGNOSIS — I10 Essential (primary) hypertension: Secondary | ICD-10-CM

## 2021-03-13 DIAGNOSIS — E782 Mixed hyperlipidemia: Secondary | ICD-10-CM | POA: Diagnosis not present

## 2021-03-13 MED ORDER — HYDRALAZINE HCL 25 MG PO TABS: 25.0000 mg | ORAL_TABLET | Freq: Three times a day (TID) | ORAL | 0 refills | Status: DC

## 2021-03-13 NOTE — Progress Notes (Signed)
BP (!) 156/79 (BP Location: Left Arm, Cuff Size: Normal)    Pulse 73    Temp 98.6 F (37 C) (Oral)    Wt 191 lb 6.4 oz (86.8 kg)    LMP 11/16/2017 (Exact Date)    SpO2 99%    BMI 32.76 kg/m    Subjective:    Patient ID: Nicole Cortez, female    DOB: 09-28-1965, 56 y.o.   MRN: [id number]  HPI: Nicole Cortez is a 56 y.o. female  Chief Complaint  Patient presents with   Hypertension   HYPERTENSION Hypertension status: controlled  Satisfied with current treatment? yes Duration of hypertension: years BP monitoring frequency:  daily BP range: 140/80 BP medication side effects:  no Medication compliance: excellent compliance Previous BP meds:benazepril and HCTZ, hydralazine Aspirin: no Recurrent headaches: no Visual changes: no Palpitations: no Dyspnea: no Chest pain: no Lower extremity edema: no Dizzy/lightheaded: no  Relevant past medical, surgical, family and social history reviewed and updated as indicated. Interim medical history since our last visit reviewed. Allergies and medications reviewed and updated.  Review of Systems  Eyes:  Negative for visual disturbance.  Respiratory:  Negative for cough, chest tightness and shortness of breath.   Cardiovascular:  Negative for chest pain, palpitations and leg swelling.  Neurological:  Negative for dizziness and headaches.   Per HPI unless specifically indicated above     Objective:    BP (!) 156/79 (BP Location: Left Arm, Cuff Size: Normal)    Pulse 73    Temp 98.6 F (37 C) (Oral)    Wt 191 lb 6.4 oz (86.8 kg)    LMP 11/16/2017 (Exact Date)    SpO2 99%    BMI 32.76 kg/m   Wt Readings from Last 3 Encounters:  03/13/21 191 lb 6.4 oz (86.8 kg)  12/11/20 187 lb 12.8 oz (85.2 kg)  11/10/20 184 lb 3.2 oz (83.6 kg)    Physical Exam Vitals and nursing note reviewed.  Constitutional:      General: She is not in acute distress.    Appearance: Normal appearance. She is normal weight. She is not ill-appearing,  toxic-appearing or diaphoretic.  HENT:     Head: Normocephalic.     Right Ear: External ear normal.     Left Ear: External ear normal.     Nose: Nose normal.     Mouth/Throat:     Mouth: Mucous membranes are moist.     Pharynx: Oropharynx is clear.  Eyes:     General:        Right eye: No discharge.        Left eye: No discharge.     Extraocular Movements: Extraocular movements intact.     Conjunctiva/sclera: Conjunctivae normal.     Pupils: Pupils are equal, round, and reactive to light.  Cardiovascular:     Rate and Rhythm: Normal rate and regular rhythm.     Heart sounds: No murmur heard. Pulmonary:     Effort: Pulmonary effort is normal. No respiratory distress.     Breath sounds: Normal breath sounds. No wheezing or rales.  Musculoskeletal:     Cervical back: Normal range of motion and neck supple.  Skin:    General: Skin is warm and dry.     Capillary Refill: Capillary refill takes less than 2 seconds.  Neurological:     General: No focal deficit present.     Mental Status: She is alert and oriented to person, place, and time. Mental  status is at baseline.  Psychiatric:        Mood and Affect: Mood normal.        Behavior: Behavior normal.        Thought Content: Thought content normal.        Judgment: Judgment normal.    Results for orders placed or performed in visit on 11/10/20  CBC with Differential/Platelet  Result Value Ref Range   WBC 4.4 3.4 - 10.8 x10E3/uL   RBC 4.41 3.77 - 5.28 x10E6/uL   Hemoglobin 13.2 11.1 - 15.9 g/dL   Hematocrit 41.4 34.0 - 46.6 %   MCV 94 79 - 97 fL   MCH 29.9 26.6 - 33.0 pg   MCHC 31.9 31.5 - 35.7 g/dL   RDW 13.5 11.7 - 15.4 %   Platelets 359 150 - 450 x10E3/uL   Neutrophils 43 Not Estab. %   Lymphs 41 Not Estab. %   Monocytes 12 Not Estab. %   Eos 3 Not Estab. %   Basos 1 Not Estab. %   Neutrophils Absolute 1.9 1.4 - 7.0 x10E3/uL   Lymphocytes Absolute 1.8 0.7 - 3.1 x10E3/uL   Monocytes Absolute 0.5 0.1 - 0.9 x10E3/uL    EOS (ABSOLUTE) 0.2 0.0 - 0.4 x10E3/uL   Basophils Absolute 0.0 0.0 - 0.2 x10E3/uL   Immature Granulocytes 0 Not Estab. %   Immature Grans (Abs) 0.0 0.0 - 0.1 x10E3/uL  Comprehensive metabolic panel  Result Value Ref Range   Glucose 91 70 - 99 mg/dL   BUN 22 6 - 24 mg/dL   Creatinine, Ser 1.03 (H) 0.57 - 1.00 mg/dL   eGFR 64 >59 mL/min/1.73   BUN/Creatinine Ratio 21 9 - 23   Sodium 139 134 - 144 mmol/L   Potassium 4.0 3.5 - 5.2 mmol/L   Chloride 100 96 - 106 mmol/L   CO2 24 20 - 29 mmol/L   Calcium 10.2 8.7 - 10.2 mg/dL   Total Protein 8.3 6.0 - 8.5 g/dL   Albumin 5.2 (H) 3.8 - 4.9 g/dL   Globulin, Total 3.1 1.5 - 4.5 g/dL   Albumin/Globulin Ratio 1.7 1.2 - 2.2   Bilirubin Total 0.2 0.0 - 1.2 mg/dL   Alkaline Phosphatase 44 44 - 121 IU/L   AST 27 0 - 40 IU/L   ALT 21 0 - 32 IU/L  Lipid panel  Result Value Ref Range   Cholesterol, Total 278 (H) 100 - 199 mg/dL   Triglycerides 112 0 - 149 mg/dL   HDL 100 >39 mg/dL   VLDL Cholesterol Cal 19 5 - 40 mg/dL   LDL Chol Calc (NIH) 159 (H) 0 - 99 mg/dL   Chol/HDL Ratio 2.8 0.0 - 4.4 ratio  TSH  Result Value Ref Range   TSH 1.080 0.450 - 4.500 uIU/mL  Urinalysis, Routine w reflex microscopic  Result Value Ref Range   Specific Gravity, UA 1.025 1.005 - 1.030   pH, UA 5.0 5.0 - 7.5   Color, UA Yellow Yellow   Appearance Ur Clear Clear   Leukocytes,UA Negative Negative   Protein,UA Negative Negative/Trace   Glucose, UA Negative Negative   Ketones, UA Negative Negative   RBC, UA Negative Negative   Bilirubin, UA Negative Negative   Urobilinogen, Ur 0.2 0.2 - 1.0 mg/dL   Nitrite, UA Negative Negative  Cytology - PAP  Result Value Ref Range   Adequacy      Satisfactory for evaluation; transformation zone component ABSENT.   Diagnosis      -  Negative for intraepithelial lesion or malignancy (NILM)      Assessment & Plan:   Problem List Items Addressed This Visit       Cardiovascular and Mediastinum   Essential  hypertension - Primary    Chronic.  Not well controlled.  Will increase Hydralazine to 7m TID.  Continue with Benzapril and HCTZ. Labs ordered today.  Return to clinic in 1 month for reevaluation.  Call sooner if concerns arise.        Relevant Medications   hydrALAZINE (APRESOLINE) 25 MG tablet   Other Relevant Orders   Comp Met (CMET)     Follow up plan: Return in about 1 month (around 04/10/2021) for BP Check.

## 2021-03-13 NOTE — Assessment & Plan Note (Signed)
Chronic.  Not well controlled.  Will increase Hydralazine to 25mg  TID.  Continue with Benzapril and HCTZ. Labs ordered today.  Return to clinic in 1 month for reevaluation.  Call sooner if concerns arise.

## 2021-03-14 LAB — COMPREHENSIVE METABOLIC PANEL
ALT: 27 IU/L (ref 0–32)
AST: 31 IU/L (ref 0–40)
Albumin/Globulin Ratio: 1.8 (ref 1.2–2.2)
Albumin: 4.8 g/dL (ref 3.8–4.9)
Alkaline Phosphatase: 42 IU/L — ABNORMAL LOW (ref 44–121)
BUN/Creatinine Ratio: 19 (ref 9–23)
BUN: 23 mg/dL (ref 6–24)
Bilirubin Total: 0.3 mg/dL (ref 0.0–1.2)
CO2: 25 mmol/L (ref 20–29)
Calcium: 10.2 mg/dL (ref 8.7–10.2)
Chloride: 104 mmol/L (ref 96–106)
Creatinine, Ser: 1.24 mg/dL — ABNORMAL HIGH (ref 0.57–1.00)
Globulin, Total: 2.7 g/dL (ref 1.5–4.5)
Glucose: 93 mg/dL (ref 70–99)
Potassium: 4.5 mmol/L (ref 3.5–5.2)
Sodium: 141 mmol/L (ref 134–144)
Total Protein: 7.5 g/dL (ref 6.0–8.5)
eGFR: 51 mL/min/{1.73_m2} — ABNORMAL LOW (ref >59)

## 2021-03-14 NOTE — Progress Notes (Signed)
Please let patient know that her kidney function continues to be declined.  Make sure to avoid NSAIDS and drink plenty of water.  We will work hard to get her blood pressure under control which is hopefully help with the kidney function.  We will continue to monitor this in the future.  Please let me know if she has any questions.

## 2021-04-05 ENCOUNTER — Other Ambulatory Visit: Payer: Self-pay | Admitting: Nurse Practitioner

## 2021-04-07 NOTE — Telephone Encounter (Signed)
Requested Prescriptions  ?Pending Prescriptions Disp Refills  ?? hydrALAZINE (APRESOLINE) 25 MG tablet [Pharmacy Med Name: HYDRALAZINE 25 MG TABLET] 90 tablet 0  ?  Sig: TAKE 1 TABLET BY MOUTH THREE TIMES A DAY  ?  ? Cardiovascular:  Vasodilators Failed - 04/05/2021 12:33 PM  ?  ?  Failed - ANA Screen, Ifa, Serum in normal range and within 360 days  ?  No results found for: ANA, ANATITER, LABANTI   ?  ?  Failed - Last BP in normal range  ?  BP Readings from Last 1 Encounters:  ?03/13/21 (!) 156/79  ?   ?  ?  Passed - HCT in normal range and within 360 days  ?  Hematocrit  ?Date Value Ref Range Status  ?11/10/2020 41.4 34.0 - 46.6 % Final  ?   ?  ?  Passed - HGB in normal range and within 360 days  ?  Hemoglobin  ?Date Value Ref Range Status  ?11/10/2020 13.2 11.1 - 15.9 g/dL Final  ?   ?  ?  Passed - RBC in normal range and within 360 days  ?  RBC  ?Date Value Ref Range Status  ?11/10/2020 4.41 3.77 - 5.28 x10E6/uL Final  ?   ?  ?  Passed - WBC in normal range and within 360 days  ?  WBC  ?Date Value Ref Range Status  ?11/10/2020 4.4 3.4 - 10.8 x10E3/uL Final  ?   ?  ?  Passed - PLT in normal range and within 360 days  ?  Platelets  ?Date Value Ref Range Status  ?11/10/2020 359 150 - 450 x10E3/uL Final  ?   ?  ?  Passed - Valid encounter within last 12 months  ?  Recent Outpatient Visits   ?      ? 3 weeks ago Essential hypertension  ? [facility] Larae Grooms, NP  ? 3 months ago Essential hypertension  ? [facility] Larae Grooms, NP  ? 4 months ago Annual physical exam  ? [facility] Larae Grooms, NP  ? 11 months ago Essential hypertension  ? [facility] McElwee, Lauren A, NP  ? 1 year ago Essential hypertension  ? [facility] Caro Laroche, DO  ?  ?  ?Future Appointments   ?        ? In 3 days Larae Grooms, NP [facility]  ?  ? ?  ?  ?  ? ?

## 2021-04-09 NOTE — Progress Notes (Signed)
? ?BP 136/75   Pulse 81   Temp 97.6 ?F (36.4 ?C) (Oral)   Wt 193 lb 3.2 oz (87.6 kg)   LMP 11/16/2017 (Exact Date)   SpO2 99%   BMI 33.07 kg/m?   ? ?Subjective:  ? ? Patient ID: Nicole Cortez, female    DOB: 09/24/1965, 56 y.o.   MRN: [id number] ? ?HPI: ?Nicole Cortez is a 56 y.o. female ? ?Chief Complaint  ?Patient presents with  ? Hypertension  ?  1 month f/up  ? ?HYPERTENSION ?Hypertension status: controlled  ?Satisfied with current treatment? yes ?Duration of hypertension: years ?BP monitoring frequency:  daily ?BP range: 130/75 ?BP medication side effects:  no ?Medication compliance: excellent compliance ?Previous BP meds:benazepril and HCTZ, hydralazine ?Aspirin: no ?Recurrent headaches: no ?Visual changes: no ?Palpitations: no ?Dyspnea: no ?Chest pain: no ?Lower extremity edema: no ?Dizzy/lightheaded: no ? ?Relevant past medical, surgical, family and social history reviewed and updated as indicated. Interim medical history since our last visit reviewed. ?Allergies and medications reviewed and updated. ? ?Review of Systems  ?Eyes:  Negative for visual disturbance.  ?Respiratory:  Negative for cough, chest tightness and shortness of breath.   ?Cardiovascular:  Negative for chest pain, palpitations and leg swelling.  ?Neurological:  Negative for dizziness and headaches.  ? ?Per HPI unless specifically indicated above ? ?   ?Objective:  ?  ?BP 136/75   Pulse 81   Temp 97.6 ?F (36.4 ?C) (Oral)   Wt 193 lb 3.2 oz (87.6 kg)   LMP 11/16/2017 (Exact Date)   SpO2 99%   BMI 33.07 kg/m?   ?Wt Readings from Last 3 Encounters:  ?04/10/21 193 lb 3.2 oz (87.6 kg)  ?03/13/21 191 lb 6.4 oz (86.8 kg)  ?12/11/20 187 lb 12.8 oz (85.2 kg)  ?  ?Physical Exam ?Vitals and nursing note reviewed.  ?Constitutional:   ?   General: She is not in acute distress. ?   Appearance: Normal appearance. She is obese. She is not ill-appearing, toxic-appearing or diaphoretic.  ?HENT:  ?   Head: Normocephalic.  ?   Right Ear: External  ear normal.  ?   Left Ear: External ear normal.  ?   Nose: Nose normal.  ?   Mouth/Throat:  ?   Mouth: Mucous membranes are moist.  ?   Pharynx: Oropharynx is clear.  ?Eyes:  ?   General:     ?   Right eye: No discharge.     ?   Left eye: No discharge.  ?   Extraocular Movements: Extraocular movements intact.  ?   Conjunctiva/sclera: Conjunctivae normal.  ?   Pupils: Pupils are equal, round, and reactive to light.  ?Cardiovascular:  ?   Rate and Rhythm: Normal rate and regular rhythm.  ?   Heart sounds: No murmur heard. ?Pulmonary:  ?   Effort: Pulmonary effort is normal. No respiratory distress.  ?   Breath sounds: Normal breath sounds. No wheezing or rales.  ?Musculoskeletal:  ?   Cervical back: Normal range of motion and neck supple.  ?Skin: ?   General: Skin is warm and dry.  ?   Capillary Refill: Capillary refill takes less than 2 seconds.  ?Neurological:  ?   General: No focal deficit present.  ?   Mental Status: She is alert and oriented to person, place, and time. Mental status is at baseline.  ?Psychiatric:     ?   Mood and Affect: Mood normal.     ?  Behavior: Behavior normal.     ?   Thought Content: Thought content normal.     ?   Judgment: Judgment normal.  ? ? ?Results for orders placed or performed in visit on 03/13/21  ?Comp Met (CMET)  ?Result Value Ref Range  ? Glucose 93 70 - 99 mg/dL  ? BUN 23 6 - 24 mg/dL  ? Creatinine, Ser 1.24 (H) 0.57 - 1.00 mg/dL  ? eGFR 51 (L) >59 mL/min/1.73  ? BUN/Creatinine Ratio 19 9 - 23  ? Sodium 141 134 - 144 mmol/L  ? Potassium 4.5 3.5 - 5.2 mmol/L  ? Chloride 104 96 - 106 mmol/L  ? CO2 25 20 - 29 mmol/L  ? Calcium 10.2 8.7 - 10.2 mg/dL  ? Total Protein 7.5 6.0 - 8.5 g/dL  ? Albumin 4.8 3.8 - 4.9 g/dL  ? Globulin, Total 2.7 1.5 - 4.5 g/dL  ? Albumin/Globulin Ratio 1.8 1.2 - 2.2  ? Bilirubin Total 0.3 0.0 - 1.2 mg/dL  ? Alkaline Phosphatase 42 (L) 44 - 121 IU/L  ? AST 31 0 - 40 IU/L  ? ALT 27 0 - 32 IU/L  ? ?   ?Assessment & Plan:  ? ?Problem List Items Addressed  This Visit   ? ?  ? Cardiovascular and Mediastinum  ? Essential hypertension - Primary  ?  Chronic.  Controlled.  Continue with current medication regimen of Hydralazine 57m TID, Benzapril 475m and HCTZ 2547maily.  Refills sent today.  Return to clinic in 3 months for reevaluation.  Call sooner if concerns arise.  ? ?  ?  ? Relevant Medications  ? benazepril (LOTENSIN) 40 MG tablet  ? hydrALAZINE (APRESOLINE) 25 MG tablet  ? hydrochlorothiazide (HYDRODIURIL) 25 MG tablet  ? rosuvastatin (CRESTOR) 10 MG tablet  ?  ? ?Follow up plan: ?Return in about 3 months (around 07/11/2021) for HTN, HLD, DM2 FU. ? ? ? ? ? ? ?

## 2021-04-10 ENCOUNTER — Encounter: Payer: Self-pay | Admitting: Nurse Practitioner

## 2021-04-10 ENCOUNTER — Other Ambulatory Visit: Payer: Self-pay

## 2021-04-10 ENCOUNTER — Ambulatory Visit (INDEPENDENT_AMBULATORY_CARE_PROVIDER_SITE_OTHER): Payer: BC Managed Care – PPO | Admitting: Nurse Practitioner

## 2021-04-10 VITALS — BP 136/75 | HR 81 | Temp 97.6°F | Wt 193.2 lb

## 2021-04-10 DIAGNOSIS — I10 Essential (primary) hypertension: Secondary | ICD-10-CM

## 2021-04-10 MED ORDER — HYDRALAZINE HCL 25 MG PO TABS: 25.0000 mg | ORAL_TABLET | Freq: Three times a day (TID) | ORAL | 1 refills | Status: DC

## 2021-04-10 MED ORDER — BENAZEPRIL HCL 40 MG PO TABS: 40.0000 mg | ORAL_TABLET | Freq: Every day | ORAL | 1 refills | Status: DC

## 2021-04-10 MED ORDER — HYDROCHLOROTHIAZIDE 25 MG PO TABS: 25.0000 mg | ORAL_TABLET | Freq: Every day | ORAL | 1 refills | Status: DC

## 2021-04-10 MED ORDER — ROSUVASTATIN CALCIUM 10 MG PO TABS: 10.0000 mg | ORAL_TABLET | Freq: Every day | ORAL | 1 refills | Status: DC

## 2021-04-10 NOTE — Assessment & Plan Note (Signed)
Chronic.  Controlled.  Continue with current medication regimen of Hydralazine 25mg  TID, Benzapril 40mg , and HCTZ 25mg  daily.  Refills sent today.  Return to clinic in 3 months for reevaluation.  Call sooner if concerns arise.  ? ?

## 2021-06-18 ENCOUNTER — Other Ambulatory Visit: Payer: Self-pay

## 2021-06-18 MED ORDER — HYDRALAZINE HCL 25 MG PO TABS: 25.0000 mg | ORAL_TABLET | Freq: Three times a day (TID) | ORAL | 1 refills | Status: DC

## 2021-06-18 NOTE — Telephone Encounter (Signed)
90 day rx request from [facility]. Please advise. Last filled 04/10/21 #[id number] with 1 refill. Next OV 07/11/21

## 2021-07-11 ENCOUNTER — Encounter: Payer: Self-pay | Admitting: Nurse Practitioner

## 2021-07-11 ENCOUNTER — Ambulatory Visit (INDEPENDENT_AMBULATORY_CARE_PROVIDER_SITE_OTHER): Payer: BC Managed Care – PPO | Admitting: Nurse Practitioner

## 2021-07-11 VITALS — BP 131/78 | HR 88 | Temp 98.8°F | Wt 197.4 lb

## 2021-07-11 DIAGNOSIS — I10 Essential (primary) hypertension: Secondary | ICD-10-CM

## 2021-07-11 DIAGNOSIS — E782 Mixed hyperlipidemia: Secondary | ICD-10-CM

## 2021-07-11 NOTE — Assessment & Plan Note (Signed)
Chronic.  Controlled.  Continue with current medication regimen of Hydralazine 25mg  TID, Benzapril 40mg , and HCTZ 25mg  daily.  Return to clinic in 3 months for reevaluation.  Call sooner if concerns arise.

## 2021-07-11 NOTE — Progress Notes (Signed)
BP 131/78   Pulse 88   Temp 98.8 F (37.1 C) (Oral)   Wt 197 lb 6.4 oz (89.5 kg)   LMP 11/16/2017 (Exact Date)   SpO2 98%   BMI 33.79 kg/m    Subjective:    Patient ID: Maurie Boettcher, female    DOB: Jul 07, 1965, 56 y.o.   MRN: [id number]  HPI: TANAIYA KOLARIK is a 56 y.o. female  Chief Complaint  Patient presents with   Hypertension   Hyperlipidemia   Diabetes   HYPERTENSION / [place] Satisfied with current treatment? yes Duration of hypertension: years BP monitoring frequency: daily BP range: 130/70 BP medication side effects: no Past BP meds:  hydralazine, benazepril, and HCTZ Duration of hyperlipidemia: years Cholesterol medication side effects: no Cholesterol supplements: none Past cholesterol medications: rosuvastatin (crestor) Medication compliance: excellent compliance Aspirin: no Recent stressors: no Recurrent headaches: no Visual changes: no Palpitations: no Dyspnea: no Chest pain: no Lower extremity edema: no Dizzy/lightheaded: no  Relevant past medical, surgical, family and social history reviewed and updated as indicated. Interim medical history since our last visit reviewed. Allergies and medications reviewed and updated.  Review of Systems  Eyes:  Negative for visual disturbance.  Respiratory:  Negative for cough, chest tightness and shortness of breath.   Cardiovascular:  Negative for chest pain, palpitations and leg swelling.  Neurological:  Negative for dizziness and headaches.    Per HPI unless specifically indicated above     Objective:    BP 131/78   Pulse 88   Temp 98.8 F (37.1 C) (Oral)   Wt 197 lb 6.4 oz (89.5 kg)   LMP 11/16/2017 (Exact Date)   SpO2 98%   BMI 33.79 kg/m   Wt Readings from Last 3 Encounters:  07/11/21 197 lb 6.4 oz (89.5 kg)  04/10/21 193 lb 3.2 oz (87.6 kg)  03/13/21 191 lb 6.4 oz (86.8 kg)    Physical Exam Vitals and nursing note reviewed.  Constitutional:      General: She is not in acute  distress.    Appearance: Normal appearance. She is normal weight. She is not ill-appearing, toxic-appearing or diaphoretic.  HENT:     Head: Normocephalic.     Right Ear: External ear normal.     Left Ear: External ear normal.     Nose: Nose normal.     Mouth/Throat:     Mouth: Mucous membranes are moist.     Pharynx: Oropharynx is clear.  Eyes:     General:        Right eye: No discharge.        Left eye: No discharge.     Extraocular Movements: Extraocular movements intact.     Conjunctiva/sclera: Conjunctivae normal.     Pupils: Pupils are equal, round, and reactive to light.  Cardiovascular:     Rate and Rhythm: Normal rate and regular rhythm.     Heart sounds: No murmur heard. Pulmonary:     Effort: Pulmonary effort is normal. No respiratory distress.     Breath sounds: Normal breath sounds. No wheezing or rales.  Musculoskeletal:     Cervical back: Normal range of motion and neck supple.  Skin:    General: Skin is warm and dry.     Capillary Refill: Capillary refill takes less than 2 seconds.  Neurological:     General: No focal deficit present.     Mental Status: She is alert and oriented to person, place, and time. Mental status is at  baseline.  Psychiatric:        Mood and Affect: Mood normal.        Behavior: Behavior normal.        Thought Content: Thought content normal.        Judgment: Judgment normal.     Results for orders placed or performed in visit on 03/13/21  Comp Met (CMET)  Result Value Ref Range   Glucose 93 70 - 99 mg/dL   BUN 23 6 - 24 mg/dL   Creatinine, Ser 1.24 (H) 0.57 - 1.00 mg/dL   eGFR 51 (L) >59 mL/min/1.73   BUN/Creatinine Ratio 19 9 - 23   Sodium 141 134 - 144 mmol/L   Potassium 4.5 3.5 - 5.2 mmol/L   Chloride 104 96 - 106 mmol/L   CO2 25 20 - 29 mmol/L   Calcium 10.2 8.7 - 10.2 mg/dL   Total Protein 7.5 6.0 - 8.5 g/dL   Albumin 4.8 3.8 - 4.9 g/dL   Globulin, Total 2.7 1.5 - 4.5 g/dL   Albumin/Globulin Ratio 1.8 1.2 - 2.2    Bilirubin Total 0.3 0.0 - 1.2 mg/dL   Alkaline Phosphatase 42 (L) 44 - 121 IU/L   AST 31 0 - 40 IU/L   ALT 27 0 - 32 IU/L      Assessment & Plan:   Problem List Items Addressed This Visit       Cardiovascular and Mediastinum   Essential hypertension - Primary    Chronic.  Controlled.  Continue with current medication regimen of Hydralazine 16m TID, Benzapril 411m and HCTZ 2551maily.  Return to clinic in 3 months for reevaluation.  Call sooner if concerns arise.        Relevant Orders   Comp Met (CMET)     Other   Mixed hyperlipidemia    Chronic.  Controlled.  Continue with current medication regimen of Crestor 62m31mLabs ordered today.  Return to clinic in 6 months for reevaluation.  Call sooner if concerns arise.        Relevant Orders   Lipid Profile     Follow up plan: No follow-ups on file.

## 2021-07-11 NOTE — Assessment & Plan Note (Signed)
Chronic.  Controlled.  Continue with current medication regimen of Crestor 10mg.  Labs ordered today.  Return to clinic in 6 months for reevaluation.  Call sooner if concerns arise.   

## 2021-07-12 LAB — COMPREHENSIVE METABOLIC PANEL
ALT: 23 IU/L (ref 0–32)
AST: 25 IU/L (ref 0–40)
Albumin/Globulin Ratio: 1.8 (ref 1.2–2.2)
Albumin: 5 g/dL — ABNORMAL HIGH (ref 3.8–4.9)
Alkaline Phosphatase: 41 IU/L — ABNORMAL LOW (ref 44–121)
BUN/Creatinine Ratio: 23 (ref 9–23)
BUN: 30 mg/dL — ABNORMAL HIGH (ref 6–24)
Bilirubin Total: 0.3 mg/dL (ref 0.0–1.2)
CO2: 23 mmol/L (ref 20–29)
Calcium: 10.2 mg/dL (ref 8.7–10.2)
Chloride: 105 mmol/L (ref 96–106)
Creatinine, Ser: 1.28 mg/dL — ABNORMAL HIGH (ref 0.57–1.00)
Globulin, Total: 2.8 g/dL (ref 1.5–4.5)
Glucose: 107 mg/dL — ABNORMAL HIGH (ref 70–99)
Potassium: 4.3 mmol/L (ref 3.5–5.2)
Sodium: 144 mmol/L (ref 134–144)
Total Protein: 7.8 g/dL (ref 6.0–8.5)
eGFR: 49 mL/min/{1.73_m2} — ABNORMAL LOW (ref >59)

## 2021-07-12 LAB — LIPID PANEL
Chol/HDL Ratio: 1.9 ratio (ref 0.0–4.4)
Cholesterol, Total: 175 mg/dL (ref 100–199)
HDL: 90 mg/dL (ref >39)
LDL Chol Calc (NIH): 70 mg/dL (ref 0–99)
Triglycerides: 84 mg/dL (ref 0–149)
VLDL Cholesterol Cal: 15 mg/dL (ref 5–40)

## 2021-07-12 NOTE — Progress Notes (Signed)
Please let patient know that her lab work shows that her kidney function remains slightly declined.  No medication changes at this time.  We will continue to monitor this.  I would like her to avoid NSAIDS such as ibuprofen, motrin and aleve.  She should increase water intake to at least 64 ounces daily.  Please let me know if she has any questions.

## 2021-10-11 ENCOUNTER — Ambulatory Visit: Payer: BC Managed Care – PPO | Admitting: Nurse Practitioner

## 2021-12-07 ENCOUNTER — Other Ambulatory Visit: Payer: Self-pay | Admitting: Nurse Practitioner

## 2021-12-07 NOTE — Telephone Encounter (Signed)
Requested Prescriptions  Pending Prescriptions Disp Refills   benazepril (LOTENSIN) 40 MG tablet [Pharmacy Med Name: BENAZEPRIL HCL 40 MG TABLET] 90 tablet 1    Sig: TAKE 1 TABLET BY MOUTH EVERY DAY     Cardiovascular:  ACE Inhibitors Failed - 12/07/2021  2:13 AM      Failed - Cr in normal range and within 180 days    Creatinine, Ser  Date Value Ref Range Status  07/11/2021 1.28 (H) 0.57 - 1.00 mg/dL Final         Passed - K in normal range and within 180 days    Potassium  Date Value Ref Range Status  07/11/2021 4.3 3.5 - 5.2 mmol/L Final         Passed - Patient is not pregnant      Passed - Last BP in normal range    BP Readings from Last 1 Encounters:  07/11/21 131/78         Passed - Valid encounter within last 6 months    Recent Outpatient Visits           4 months ago Essential hypertension   [facility] Larae Grooms, NP   8 months ago Essential hypertension   [facility] Larae Grooms, NP   8 months ago Essential hypertension   [facility] Larae Grooms, NP   12 months ago Essential hypertension   [facility] Larae Grooms, NP   1 year ago Annual physical exam   [facility] Larae Grooms, NP       Future Appointments             In 4 days Larae Grooms, NP [facility]             rosuvastatin (CRESTOR) 10 MG tablet [Pharmacy Med Name: ROSUVASTATIN CALCIUM 10 MG TAB] 90 tablet 3    Sig: TAKE 1 TABLET BY MOUTH EVERY DAY     Cardiovascular:  Antilipid - Statins 2 Failed - 12/07/2021  2:13 AM      Failed - Cr in normal range and within 360 days    Creatinine, Ser  Date Value Ref Range Status  07/11/2021 1.28 (H) 0.57 - 1.00 mg/dL Final         Failed - Lipid Panel in normal range within the last 12 months    Cholesterol, Total  Date Value Ref Range Status  07/11/2021 175 100 - 199 mg/dL Final   LDL Chol Calc (NIH)  Date Value Ref Range  Status  07/11/2021 70 0 - 99 mg/dL Final   HDL  Date Value Ref Range Status  07/11/2021 90 >39 mg/dL Final   Triglycerides  Date Value Ref Range Status  07/11/2021 84 0 - 149 mg/dL Final         Passed - Patient is not pregnant      Passed - Valid encounter within last 12 months    Recent Outpatient Visits           4 months ago Essential hypertension   [facility] Larae Grooms, NP   8 months ago Essential hypertension   [facility] Larae Grooms, NP   8 months ago Essential hypertension   [facility] Larae Grooms, NP   12 months ago Essential hypertension   [facility] Larae Grooms, NP   1 year ago Annual physical exam   [facility] Larae Grooms, NP  Future Appointments             In 4 days Jon Billings, NP [facility] [place]

## 2021-12-10 NOTE — Progress Notes (Unsigned)
LMP 11/16/2017 (Exact Date)    Subjective:    Patient ID: Nicole Cortez, female    DOB: Sep 13, 1965, 56 y.o.   MRN: [id number]  HPI: Nicole Cortez is a 56 y.o. female presenting on 12/11/2021 for comprehensive medical examination. Current medical complaints include:{Blank single:19197::"none","***"}  She currently lives with: Menopausal Symptoms: {Blank single:19197::"yes","no"}  HYPERTENSION / [facility] Satisfied with current treatment? {Blank single:19197::"yes","no"} Duration of hypertension: {Blank single:19197::"chronic","months","years"} BP monitoring frequency: {Blank single:19197::"not checking","rarely","daily","weekly","monthly","a few times a day","a few times a week","a few times a month"} BP range:  BP medication side effects: {Blank single:19197::"yes","no"} Past BP meds: {Blank IDPOEUMP:53614::"ERXV","QMGQQPYPPJ","KDTOIZTIWP/YKDXIPJASN","KNLZJQBH","ALPFXTKWIO","XBDZHGDJME/QAST","MHDQQIWLNL (bystolic)","carvedilol","chlorthalidone","clonidine","diltiazem","exforge HCT","HCTZ","irbesartan (avapro)","labetalol","lisinopril","lisinopril-HCTZ","losartan (cozaar)","methyldopa","nifedipine","olmesartan (benicar)","olmesartan-HCTZ","quinapril","ramipril","spironalactone","tekturna","valsartan","valsartan-HCTZ","verapamil"} Duration of hyperlipidemia: {Blank single:19197::"chronic","months","years"} Cholesterol medication side effects: {Blank single:19197::"yes","no"} Cholesterol supplements: {Blank multiple:19196::"none","fish oil","niacin","red yeast rice"} Past cholesterol medications: {Blank multiple:19196::"none","atorvastain (lipitor)","lovastatin (mevacor)","pravastatin (pravachol)","rosuvastatin (crestor)","simvastatin (zocor)","vytorin","fenofibrate (tricor)","gemfibrozil","ezetimide (zetia)","niaspan","lovaza"} Medication compliance: {Blank single:19197::"excellent compliance","good compliance","fair compliance","poor compliance"} Aspirin: {Blank  single:19197::"yes","no"} Recent stressors: {Blank single:19197::"yes","no"} Recurrent headaches: {Blank single:19197::"yes","no"} Visual changes: {Blank single:19197::"yes","no"} Palpitations: {Blank single:19197::"yes","no"} Dyspnea: {Blank single:19197::"yes","no"} Chest pain: {Blank single:19197::"yes","no"} Lower extremity edema: {Blank single:19197::"yes","no"} Dizzy/lightheaded: {Blank single:19197::"yes","no"}  Depression Screen done today and results listed below:     07/11/2021    2:15 PM 04/10/2021    9:55 AM 03/13/2021    8:52 AM 12/11/2020    1:58 PM 06/16/2019    4:13 PM  Depression screen PHQ 2/9  Decreased Interest 0 0 0 0 0  Down, Depressed, Hopeless 0 0 0 0 0  PHQ - 2 Score 0 0 0 0 0  Altered sleeping 0 0 0 0 3  Tired, decreased energy 1 0 1 0 1  Change in appetite 0 0 0 0 1  Feeling bad or failure about yourself  0 0 0 0 0  Trouble concentrating 0 0 0 0 0  Moving slowly or fidgety/restless 0 0 0 0 0  Suicidal thoughts 0 0 0 0 0  PHQ-9 Score 1 0 1 0 5  Difficult doing work/chores Not difficult at all Not difficult at all Not difficult at all Not difficult at all     The patient {has/does not GXQJ:19417} a history of falls. I {did/did not:19850} complete a risk assessment for falls. A plan of care for falls {was/was not:19852} documented.   Past Medical History:  Past Medical History:  Diagnosis Date   Hypertension     Surgical History:  Past Surgical History:  Procedure Laterality Date   TUBAL LIGATION      Medications:  Current Outpatient Medications on File Prior to Visit  Medication Sig   benazepril (LOTENSIN) 40 MG tablet TAKE 1 TABLET BY MOUTH EVERY DAY   hydrALAZINE (APRESOLINE) 25 MG tablet Take 1 tablet (25 mg total) by mouth 3 (three) times daily.   hydrochlorothiazide (HYDRODIURIL) 25 MG tablet Take 1 tablet (25 mg total) by mouth daily.   rosuvastatin (CRESTOR) 10 MG tablet TAKE 1 TABLET BY MOUTH EVERY DAY   VITAMIN D, ERGOCALCIFEROL, PO  Take 5,000 Units by mouth.   No current facility-administered medications on file prior to visit.    Allergies:  Allergies  Allergen Reactions   Amlodipine Swelling    Gums swell    Social History:  Social History   Socioeconomic History   Marital status: Single    Spouse name: Not on file   Number of children: Not on file   Years of education: Not on file   Highest education level: Not on file  Occupational History   Not on file  Tobacco Use  Smoking status: Never   Smokeless tobacco: Never  Vaping Use   Vaping Use: Never used  Substance and Sexual Activity   Alcohol use: Yes    Alcohol/week: 3.0 standard drinks of alcohol    Types: 3 Cans of beer per week    Comment: On Weekends   Drug use: Never   Sexual activity: Yes  Other Topics Concern   Not on file  Social History Narrative   Not on file   Social Determinants of Health   Financial Resource Strain: Not on file  Food Insecurity: Not on file  Transportation Needs: Not on file  Physical Activity: Not on file  Stress: Not on file  Social Connections: Not on file  Intimate Partner Violence: Not on file   Social History   Tobacco Use  Smoking Status Never  Smokeless Tobacco Never   Social History   Substance and Sexual Activity  Alcohol Use Yes   Alcohol/week: 3.0 standard drinks of alcohol   Types: 3 Cans of beer per week   Comment: On Weekends    Family History:  Family History  Problem Relation Age of Onset   Hypertension Mother    Kidney disease Mother    Hypertension Maternal Aunt    Kidney disease Maternal Aunt    Hypertension Maternal Uncle    Kidney disease Maternal Uncle    Kidney disease Maternal Grandmother     Past medical history, surgical history, medications, allergies, family history and social history reviewed with patient today and changes made to appropriate areas of the chart.   ROS All other ROS negative except what is listed above and in the HPI.       Objective:    LMP 11/16/2017 (Exact Date)   Wt Readings from Last 3 Encounters:  07/11/21 197 lb 6.4 oz (89.5 kg)  04/10/21 193 lb 3.2 oz (87.6 kg)  03/13/21 191 lb 6.4 oz (86.8 kg)    Physical Exam  Results for orders placed or performed in visit on 07/11/21  Comp Met (CMET)  Result Value Ref Range   Glucose 107 (H) 70 - 99 mg/dL   BUN 30 (H) 6 - 24 mg/dL   Creatinine, Ser 1.28 (H) 0.57 - 1.00 mg/dL   eGFR 49 (L) >59 mL/min/1.73   BUN/Creatinine Ratio 23 9 - 23   Sodium 144 134 - 144 mmol/L   Potassium 4.3 3.5 - 5.2 mmol/L   Chloride 105 96 - 106 mmol/L   CO2 23 20 - 29 mmol/L   Calcium 10.2 8.7 - 10.2 mg/dL   Total Protein 7.8 6.0 - 8.5 g/dL   Albumin 5.0 (H) 3.8 - 4.9 g/dL   Globulin, Total 2.8 1.5 - 4.5 g/dL   Albumin/Globulin Ratio 1.8 1.2 - 2.2   Bilirubin Total 0.3 0.0 - 1.2 mg/dL   Alkaline Phosphatase 41 (L) 44 - 121 IU/L   AST 25 0 - 40 IU/L   ALT 23 0 - 32 IU/L  Lipid Profile  Result Value Ref Range   Cholesterol, Total 175 100 - 199 mg/dL   Triglycerides 84 0 - 149 mg/dL   HDL 90 >39 mg/dL   VLDL Cholesterol Cal 15 5 - 40 mg/dL   LDL Chol Calc (NIH) 70 0 - 99 mg/dL   Chol/HDL Ratio 1.9 0.0 - 4.4 ratio      Assessment & Plan:   Problem List Items Addressed This Visit       Cardiovascular and Mediastinum   Essential hypertension - Primary  Other   Mixed hyperlipidemia     Follow up plan: No follow-ups on file.   LABORATORY TESTING:  - Pap smear: {Blank KTCCEQ:33744::"ZHQ done","not applicable","up to date","done elsewhere"}  IMMUNIZATIONS:   - Tdap: Tetanus vaccination status reviewed: {tetanus status:315746}. - Influenza: {Blank single:19197::"Up to date","Administered today","Postponed to flu season","Refused","Given elsewhere"} - Pneumovax: {Blank single:19197::"Up to date","Administered today","Not applicable","Refused","Given elsewhere"} - Prevnar: {Blank single:19197::"Up to date","Administered today","Not  applicable","Refused","Given elsewhere"} - COVID: {Blank single:19197::"Up to date","Administered today","Not applicable","Refused","Given elsewhere"} - HPV: {Blank single:19197::"Up to date","Administered today","Not applicable","Refused","Given elsewhere"} - Shingrix vaccine: {Blank single:19197::"Up to date","Administered today","Not applicable","Refused","Given elsewhere"}  SCREENING: -Mammogram: {Blank single:19197::"Up to date","Ordered today","Not applicable","Refused","Done elsewhere"}  - Colonoscopy: {Blank single:19197::"Up to date","Ordered today","Not applicable","Refused","Done elsewhere"}  - Bone Density: {Blank single:19197::"Up to date","Ordered today","Not applicable","Refused","Done elsewhere"}  -Hearing Test: {Blank single:19197::"Up to date","Ordered today","Not applicable","Refused","Done elsewhere"}  -Spirometry: {Blank single:19197::"Up to date","Ordered today","Not applicable","Refused","Done elsewhere"}   PATIENT COUNSELING:   Advised to take 1 mg of folate supplement per day if capable of pregnancy.   Sexuality: Discussed sexually transmitted diseases, partner selection, use of condoms, avoidance of unintended pregnancy  and contraceptive alternatives.   Advised to avoid cigarette smoking.  I discussed with the patient that most people either abstain from alcohol or drink within safe limits (<=14/week and <=4 drinks/occasion for males, <=7/weeks and <= 3 drinks/occasion for females) and that the risk for alcohol disorders and other health effects rises proportionally with the number of drinks per week and how often a drinker exceeds daily limits.  Discussed cessation/primary prevention of drug use and availability of treatment for abuse.   Diet: Encouraged to adjust caloric intake to maintain  or achieve ideal body weight, to reduce intake of dietary saturated fat and total fat, to limit sodium intake by avoiding high sodium foods and not adding table salt, and to  maintain adequate dietary potassium and calcium preferably from fresh fruits, vegetables, and low-fat dairy products.    stressed the importance of regular exercise  Injury prevention: Discussed safety belts, safety helmets, smoke detector, smoking near bedding or upholstery.   Dental health: Discussed importance of regular tooth brushing, flossing, and dental visits.    NEXT PREVENTATIVE PHYSICAL DUE IN 1 YEAR. No follow-ups on file.

## 2021-12-11 ENCOUNTER — Ambulatory Visit (INDEPENDENT_AMBULATORY_CARE_PROVIDER_SITE_OTHER): Payer: BC Managed Care – PPO | Admitting: Nurse Practitioner

## 2021-12-11 ENCOUNTER — Encounter: Payer: Self-pay | Admitting: Nurse Practitioner

## 2021-12-11 VITALS — BP 120/86 | HR 74 | Temp 98.0°F | Ht 64.75 in | Wt 194.9 lb

## 2021-12-11 DIAGNOSIS — Z1211 Encounter for screening for malignant neoplasm of colon: Secondary | ICD-10-CM | POA: Diagnosis not present

## 2021-12-11 DIAGNOSIS — E782 Mixed hyperlipidemia: Secondary | ICD-10-CM | POA: Diagnosis not present

## 2021-12-11 DIAGNOSIS — Z0000 Encounter for general adult medical examination without abnormal findings: Secondary | ICD-10-CM

## 2021-12-11 DIAGNOSIS — I10 Essential (primary) hypertension: Secondary | ICD-10-CM | POA: Diagnosis not present

## 2021-12-11 DIAGNOSIS — Z6832 Body mass index (BMI) 32.0-32.9, adult: Secondary | ICD-10-CM | POA: Insufficient documentation

## 2021-12-11 DIAGNOSIS — E669 Obesity, unspecified: Secondary | ICD-10-CM | POA: Insufficient documentation

## 2021-12-11 LAB — URINALYSIS, ROUTINE W REFLEX MICROSCOPIC
Bilirubin, UA: NEGATIVE
Glucose, UA: NEGATIVE
Ketones, UA: NEGATIVE
Leukocytes,UA: NEGATIVE
Nitrite, UA: NEGATIVE
Protein,UA: NEGATIVE
RBC, UA: NEGATIVE
Specific Gravity, UA: 1.015 (ref 1.005–1.030)
Urobilinogen, Ur: 2 mg/dL — ABNORMAL HIGH (ref 0.2–1.0)
pH, UA: 7 (ref 5.0–7.5)

## 2021-12-11 MED ORDER — HYDRALAZINE HCL 25 MG PO TABS: 25.0000 mg | ORAL_TABLET | Freq: Three times a day (TID) | ORAL | 1 refills | Status: DC

## 2021-12-11 MED ORDER — HYDROCHLOROTHIAZIDE 25 MG PO TABS: 25.0000 mg | ORAL_TABLET | Freq: Every day | ORAL | 1 refills | Status: DC

## 2021-12-11 MED ORDER — BENAZEPRIL HCL 40 MG PO TABS: 40.0000 mg | ORAL_TABLET | Freq: Every day | ORAL | 1 refills | Status: DC

## 2021-12-11 NOTE — Assessment & Plan Note (Signed)
Chronic.  Controlled.  Continue with current medication regimen of Hydralazine 25mg  TID, Benzapril 40mg , and HCTZ 25mg  daily.  Refills sent today.  Labs ordered today.  Return to clinic in 6 months for reevaluation.  Call sooner if concerns arise.

## 2021-12-11 NOTE — Assessment & Plan Note (Signed)
Recommended eating smaller high protein, low fat meals more frequently and exercising 30 mins a day 5 times a week with a goal of 10-15lb weight loss in the next 3 months. Patient voiced their understanding and motivation to adhere to these recommendations.  

## 2021-12-11 NOTE — Assessment & Plan Note (Signed)
Chronic.  Controlled.  Continue with current medication regimen of Crestor 10mg daily.  Refills sent today.  Labs ordered today.  Return to clinic in 6 months for reevaluation.  Call sooner if concerns arise.   

## 2021-12-12 LAB — CBC WITH DIFFERENTIAL/PLATELET
Basophils Absolute: 0 10*3/uL (ref 0.0–0.2)
Basos: 1 %
EOS (ABSOLUTE): 0.1 10*3/uL (ref 0.0–0.4)
Eos: 3 %
Hematocrit: 39.7 % (ref 34.0–46.6)
Hemoglobin: 12.8 g/dL (ref 11.1–15.9)
Immature Grans (Abs): 0 10*3/uL (ref 0.0–0.1)
Immature Granulocytes: 0 %
Lymphocytes Absolute: 1.9 10*3/uL (ref 0.7–3.1)
Lymphs: 47 %
MCH: 29.5 pg (ref 26.6–33.0)
MCHC: 32.2 g/dL (ref 31.5–35.7)
MCV: 92 fL (ref 79–97)
Monocytes Absolute: 0.4 10*3/uL (ref 0.1–0.9)
Monocytes: 11 %
Neutrophils Absolute: 1.5 10*3/uL (ref 1.4–7.0)
Neutrophils: 38 %
Platelets: 346 10*3/uL (ref 150–450)
RBC: 4.34 x10E6/uL (ref 3.77–5.28)
RDW: 13.3 % (ref 11.7–15.4)
WBC: 4 10*3/uL (ref 3.4–10.8)

## 2021-12-12 LAB — COMPREHENSIVE METABOLIC PANEL
ALT: 28 IU/L (ref 0–32)
AST: 33 IU/L (ref 0–40)
Albumin/Globulin Ratio: 1.7 (ref 1.2–2.2)
Albumin: 5 g/dL — ABNORMAL HIGH (ref 3.8–4.9)
Alkaline Phosphatase: 42 IU/L — ABNORMAL LOW (ref 44–121)
BUN/Creatinine Ratio: 23 (ref 9–23)
BUN: 24 mg/dL (ref 6–24)
Bilirubin Total: 0.3 mg/dL (ref 0.0–1.2)
CO2: 25 mmol/L (ref 20–29)
Calcium: 10.2 mg/dL (ref 8.7–10.2)
Chloride: 100 mmol/L (ref 96–106)
Creatinine, Ser: 1.05 mg/dL — ABNORMAL HIGH (ref 0.57–1.00)
Globulin, Total: 3 g/dL (ref 1.5–4.5)
Glucose: 96 mg/dL (ref 70–99)
Potassium: 4.2 mmol/L (ref 3.5–5.2)
Sodium: 139 mmol/L (ref 134–144)
Total Protein: 8 g/dL (ref 6.0–8.5)
eGFR: 62 mL/min/{1.73_m2} (ref >59)

## 2021-12-12 LAB — LIPID PANEL
Chol/HDL Ratio: 2.1 ratio (ref 0.0–4.4)
Cholesterol, Total: 194 mg/dL (ref 100–199)
HDL: 93 mg/dL (ref >39)
LDL Chol Calc (NIH): 89 mg/dL (ref 0–99)
Triglycerides: 66 mg/dL (ref 0–149)
VLDL Cholesterol Cal: 12 mg/dL (ref 5–40)

## 2021-12-12 LAB — TSH: TSH: 1.25 u[IU]/mL (ref 0.450–4.500)

## 2021-12-12 NOTE — Progress Notes (Signed)
Please let patient know that her lab work looks good.  Kidney function remains stable.  Cholesterol, liver, kidneys and electrolytes look good.  No concerns at this time.  Continue with current medication regimen.  Follow up as discussed.

## 2022-05-21 ENCOUNTER — Other Ambulatory Visit: Payer: Self-pay | Admitting: Nurse Practitioner

## 2022-05-21 NOTE — Telephone Encounter (Signed)
Requested Prescriptions  Pending Prescriptions Disp Refills   hydrochlorothiazide (HYDRODIURIL) 25 MG tablet [Pharmacy Med Name: HYDROCHLOROTHIAZIDE 25 MG TAB] 90 tablet 0    Sig: TAKE 1 TABLET (25 MG TOTAL) BY MOUTH DAILY.     Cardiovascular: Diuretics - Thiazide Failed - 05/21/2022  1:30 PM      Failed - Cr in normal range and within 180 days    Creatinine, Ser  Date Value Ref Range Status  12/11/2021 1.05 (H) 0.57 - 1.00 mg/dL Final         Passed - K in normal range and within 180 days    Potassium  Date Value Ref Range Status  12/11/2021 4.2 3.5 - 5.2 mmol/L Final         Passed - Na in normal range and within 180 days    Sodium  Date Value Ref Range Status  12/11/2021 139 134 - 144 mmol/L Final         Passed - Last BP in normal range    BP Readings from Last 1 Encounters:  12/11/21 120/86         Passed - Valid encounter within last 6 months    Recent Outpatient Visits           5 months ago Essential hypertension   [facility] Larae Grooms, NP   10 months ago Essential hypertension   [facility] Larae Grooms, NP   1 year ago Essential hypertension   [facility] Larae Grooms, NP   1 year ago Essential hypertension   [facility] Larae Grooms, NP   1 year ago Essential hypertension   [facility] Larae Grooms, NP       Future Appointments             In 3 weeks Larae Grooms, NP [facility]

## 2022-06-11 ENCOUNTER — Encounter: Payer: Self-pay | Admitting: Nurse Practitioner

## 2022-06-11 ENCOUNTER — Ambulatory Visit (INDEPENDENT_AMBULATORY_CARE_PROVIDER_SITE_OTHER): Payer: BC Managed Care – PPO | Admitting: Nurse Practitioner

## 2022-06-11 VITALS — BP 138/60 | HR 75 | Temp 97.8°F | Wt 193.2 lb

## 2022-06-11 DIAGNOSIS — E782 Mixed hyperlipidemia: Secondary | ICD-10-CM

## 2022-06-11 DIAGNOSIS — Z0000 Encounter for general adult medical examination without abnormal findings: Secondary | ICD-10-CM

## 2022-06-11 DIAGNOSIS — I10 Essential (primary) hypertension: Secondary | ICD-10-CM

## 2022-06-11 DIAGNOSIS — Z1211 Encounter for screening for malignant neoplasm of colon: Secondary | ICD-10-CM | POA: Diagnosis not present

## 2022-06-11 DIAGNOSIS — E669 Obesity, unspecified: Secondary | ICD-10-CM | POA: Diagnosis not present

## 2022-06-11 DIAGNOSIS — Z1231 Encounter for screening mammogram for malignant neoplasm of breast: Secondary | ICD-10-CM

## 2022-06-11 LAB — URINALYSIS, ROUTINE W REFLEX MICROSCOPIC
Bilirubin, UA: NEGATIVE
Glucose, UA: NEGATIVE
Ketones, UA: NEGATIVE
Leukocytes,UA: NEGATIVE
Nitrite, UA: NEGATIVE
Protein,UA: NEGATIVE
RBC, UA: NEGATIVE
Specific Gravity, UA: 1.025 (ref 1.005–1.030)
Urobilinogen, Ur: 0.2 mg/dL (ref 0.2–1.0)
pH, UA: 5.5 (ref 5.0–7.5)

## 2022-06-11 MED ORDER — HYDROCHLOROTHIAZIDE 25 MG PO TABS: 25.0000 mg | ORAL_TABLET | Freq: Every day | ORAL | 1 refills | Status: DC

## 2022-06-11 MED ORDER — ROSUVASTATIN CALCIUM 10 MG PO TABS: 10.0000 mg | ORAL_TABLET | Freq: Every day | ORAL | 1 refills | Status: DC

## 2022-06-11 MED ORDER — BENAZEPRIL HCL 40 MG PO TABS: 40.0000 mg | ORAL_TABLET | Freq: Every day | ORAL | 1 refills | Status: DC

## 2022-06-11 MED ORDER — HYDRALAZINE HCL 25 MG PO TABS: 25.0000 mg | ORAL_TABLET | Freq: Three times a day (TID) | ORAL | 1 refills | Status: DC

## 2022-06-11 NOTE — Assessment & Plan Note (Signed)
Chronic.  Controlled.  Continue with current medication regimen of Crestor.  Refills sent today.  Labs ordered today.  Return to clinic in 6 months for reevaluation.  Call sooner if concerns arise.

## 2022-06-11 NOTE — Assessment & Plan Note (Signed)
Chronic.  Controlled.  Continue with current medication regimen of Hydralazine, HCTZ, and Benazepril.  Refills sent today.  Labs ordered today.  Return to clinic in 6 months for reevaluation.  Call sooner if concerns arise.

## 2022-06-11 NOTE — Progress Notes (Signed)
BP 138/60   Pulse 75   Temp 97.8 F (36.6 C) (Oral)   Wt 193 lb 3.2 oz (87.6 kg)   LMP 11/16/2017 (Exact Date)   SpO2 100%   BMI 32.40 kg/m    Subjective:    Patient ID: Nicole Cortez, female    DOB: 20-Jun-1965, 57 y.o.   MRN: [id number]  HPI: Nicole Cortez is a 57 y.o. female presenting on 06/11/2022 for comprehensive medical examination. Current medical complaints include:none  She currently lives with: Menopausal Symptoms: no  HYPERTENSION Hypertension status: uncontrolled  Satisfied with current treatment? no Duration of hypertension: years BP monitoring frequency:  checking at work BP range: 138/60 BP medication side effects:  no Medication compliance: excellent compliance Previous BP meds:benazepril/HCTZ, hydralazine Aspirin: no Recurrent headaches: no Visual changes: no Palpitations: no Dyspnea: no Chest pain: no Lower extremity edema: no Dizzy/lightheaded: no  Depression Screen done today and results listed below:     06/11/2022    8:44 AM 12/11/2021    9:20 AM 07/11/2021    2:15 PM 04/10/2021    9:55 AM 03/13/2021    8:52 AM  Depression screen PHQ 2/9  Decreased Interest 0 0 0 0 0  Down, Depressed, Hopeless 0 0 0 0 0  PHQ - 2 Score 0 0 0 0 0  Altered sleeping 0 1 0 0 0  Tired, decreased energy 0 1 1 0 1  Change in appetite 0 0 0 0 0  Feeling bad or failure about yourself  0 0 0 0 0  Trouble concentrating 0 0 0 0 0  Moving slowly or fidgety/restless 0 0 0 0 0  Suicidal thoughts 0 0 0 0 0  PHQ-9 Score 0 2 1 0 1  Difficult doing work/chores Not difficult at all  Not difficult at all Not difficult at all Not difficult at all    The patient does not have a history of falls. I did complete a risk assessment for falls. A plan of care for falls was documented.   Past Medical History:  Past Medical History:  Diagnosis Date   Hypertension     Surgical History:  Past Surgical History:  Procedure Laterality Date   TUBAL LIGATION      Medications:   Current Outpatient Medications on File Prior to Visit  Medication Sig   VITAMIN D, ERGOCALCIFEROL, PO Take 5,000 Units by mouth.   No current facility-administered medications on file prior to visit.    Allergies:  Allergies  Allergen Reactions   Amlodipine Swelling    Gums swell    Social History:  Social History   Socioeconomic History   Marital status: Single    Spouse name: Not on file   Number of children: Not on file   Years of education: Not on file   Highest education level: Not on file  Occupational History   Not on file  Tobacco Use   Smoking status: Never   Smokeless tobacco: Never  Vaping Use   Vaping Use: Never used  Substance and Sexual Activity   Alcohol use: Yes    Alcohol/week: 3.0 standard drinks of alcohol    Types: 3 Cans of beer per week    Comment: On Weekends   Drug use: Never   Sexual activity: Yes  Other Topics Concern   Not on file  Social History Narrative   Not on file   Social Determinants of Health   Financial Resource Strain: Not on file  Food Insecurity:  Not on file  Transportation Needs: Not on file  Physical Activity: Not on file  Stress: Not on file  Social Connections: Not on file  Intimate Partner Violence: Not on file   Social History   Tobacco Use  Smoking Status Never  Smokeless Tobacco Never   Social History   Substance and Sexual Activity  Alcohol Use Yes   Alcohol/week: 3.0 standard drinks of alcohol   Types: 3 Cans of beer per week   Comment: On Weekends    Family History:  Family History  Problem Relation Age of Onset   Hypertension Mother    Kidney disease Mother    Hypertension Maternal Aunt    Kidney disease Maternal Aunt    Hypertension Maternal Uncle    Kidney disease Maternal Uncle    Kidney disease Maternal Grandmother     Past medical history, surgical history, medications, allergies, family history and social history reviewed with patient today and changes made to appropriate areas  of the chart.   Review of Systems  Eyes:  Negative for blurred vision and double vision.  Respiratory:  Negative for shortness of breath.   Cardiovascular:  Negative for chest pain, palpitations and leg swelling.  Neurological:  Negative for dizziness and headaches.   All other ROS negative except what is listed above and in the HPI.      Objective:    BP 138/60   Pulse 75   Temp 97.8 F (36.6 C) (Oral)   Wt 193 lb 3.2 oz (87.6 kg)   LMP 11/16/2017 (Exact Date)   SpO2 100%   BMI 32.40 kg/m   Wt Readings from Last 3 Encounters:  06/11/22 193 lb 3.2 oz (87.6 kg)  12/11/21 194 lb 14.4 oz (88.4 kg)  07/11/21 197 lb 6.4 oz (89.5 kg)    Physical Exam Vitals and nursing note reviewed. Exam conducted with a chaperone present Randa Lynn, CMA).  Constitutional:      General: She is awake. She is not in acute distress.    Appearance: Normal appearance. She is well-developed. She is obese. She is not ill-appearing.  HENT:     Head: Normocephalic and atraumatic.     Right Ear: Hearing, tympanic membrane, ear canal and external ear normal. No drainage.     Left Ear: Hearing, tympanic membrane, ear canal and external ear normal. No drainage.     Nose: Nose normal.     Right Sinus: No maxillary sinus tenderness or frontal sinus tenderness.     Left Sinus: No maxillary sinus tenderness or frontal sinus tenderness.     Mouth/Throat:     Mouth: Mucous membranes are moist.     Pharynx: Oropharynx is clear. Uvula midline. No pharyngeal swelling, oropharyngeal exudate or posterior oropharyngeal erythema.  Eyes:     General: Lids are normal.        Right eye: No discharge.        Left eye: No discharge.     Extraocular Movements: Extraocular movements intact.     Conjunctiva/sclera: Conjunctivae normal.     Pupils: Pupils are equal, round, and reactive to light.     Visual Fields: Right eye visual fields normal and left eye visual fields normal.  Neck:     Thyroid: No thyromegaly.      Vascular: No carotid bruit.     Trachea: Trachea normal.  Cardiovascular:     Rate and Rhythm: Normal rate and regular rhythm.     Heart sounds: Normal heart sounds. No  murmur heard.    No gallop.  Pulmonary:     Effort: Pulmonary effort is normal. No accessory muscle usage or respiratory distress.     Breath sounds: Normal breath sounds.  Chest:  Breasts:    Right: Normal.     Left: Normal.  Abdominal:     General: Bowel sounds are normal.     Palpations: Abdomen is soft. There is no hepatomegaly or splenomegaly.     Tenderness: There is no abdominal tenderness.  Genitourinary:    Vagina: Normal.     Cervix: Normal.     Adnexa: Right adnexa normal and left adnexa normal.  Musculoskeletal:        General: Normal range of motion.     Cervical back: Normal range of motion and neck supple.     Right lower leg: No edema.     Left lower leg: No edema.  Lymphadenopathy:     Head:     Right side of head: No submental, submandibular, tonsillar, preauricular or posterior auricular adenopathy.     Left side of head: No submental, submandibular, tonsillar, preauricular or posterior auricular adenopathy.     Cervical: No cervical adenopathy.     Upper Body:     Right upper body: No supraclavicular, axillary or pectoral adenopathy.     Left upper body: No supraclavicular, axillary or pectoral adenopathy.  Skin:    General: Skin is warm and dry.     Capillary Refill: Capillary refill takes less than 2 seconds.     Findings: No rash.  Neurological:     Mental Status: She is alert and oriented to person, place, and time.     Gait: Gait is intact.     Deep Tendon Reflexes: Reflexes are normal and symmetric.     Reflex Scores:      Brachioradialis reflexes are 2+ on the right side and 2+ on the left side.      Patellar reflexes are 2+ on the right side and 2+ on the left side. Psychiatric:        Attention and Perception: Attention normal.        Mood and Affect: Mood normal.         Speech: Speech normal.        Behavior: Behavior normal. Behavior is cooperative.        Thought Content: Thought content normal.        Judgment: Judgment normal.     Results for orders placed or performed in visit on 12/11/21  CBC with Differential/Platelet  Result Value Ref Range   WBC 4.0 3.4 - 10.8 x10E3/uL   RBC 4.34 3.77 - 5.28 x10E6/uL   Hemoglobin 12.8 11.1 - 15.9 g/dL   Hematocrit 16.1 09.6 - 46.6 %   MCV 92 79 - 97 fL   MCH 29.5 26.6 - 33.0 pg   MCHC 32.2 31.5 - 35.7 g/dL   RDW 04.5 40.9 - 81.1 %   Platelets 346 150 - 450 x10E3/uL   Neutrophils 38 Not Estab. %   Lymphs 47 Not Estab. %   Monocytes 11 Not Estab. %   Eos 3 Not Estab. %   Basos 1 Not Estab. %   Neutrophils Absolute 1.5 1.4 - 7.0 x10E3/uL   Lymphocytes Absolute 1.9 0.7 - 3.1 x10E3/uL   Monocytes Absolute 0.4 0.1 - 0.9 x10E3/uL   EOS (ABSOLUTE) 0.1 0.0 - 0.4 x10E3/uL   Basophils Absolute 0.0 0.0 - 0.2 x10E3/uL   Immature Granulocytes 0 Not  Estab. %   Immature Grans (Abs) 0.0 0.0 - 0.1 x10E3/uL  Comprehensive metabolic panel  Result Value Ref Range   Glucose 96 70 - 99 mg/dL   BUN 24 6 - 24 mg/dL   Creatinine, Ser 4.54 (H) 0.57 - 1.00 mg/dL   eGFR 62 >09 WJ/XBJ/4.78   BUN/Creatinine Ratio 23 9 - 23   Sodium 139 134 - 144 mmol/L   Potassium 4.2 3.5 - 5.2 mmol/L   Chloride 100 96 - 106 mmol/L   CO2 25 20 - 29 mmol/L   Calcium 10.2 8.7 - 10.2 mg/dL   Total Protein 8.0 6.0 - 8.5 g/dL   Albumin 5.0 (H) 3.8 - 4.9 g/dL   Globulin, Total 3.0 1.5 - 4.5 g/dL   Albumin/Globulin Ratio 1.7 1.2 - 2.2   Bilirubin Total 0.3 0.0 - 1.2 mg/dL   Alkaline Phosphatase 42 (L) 44 - 121 IU/L   AST 33 0 - 40 IU/L   ALT 28 0 - 32 IU/L  Lipid panel  Result Value Ref Range   Cholesterol, Total 194 100 - 199 mg/dL   Triglycerides 66 0 - 149 mg/dL   HDL 93 >29 mg/dL   VLDL Cholesterol Cal 12 5 - 40 mg/dL   LDL Chol Calc (NIH) 89 0 - 99 mg/dL   Chol/HDL Ratio 2.1 0.0 - 4.4 ratio  TSH  Result Value Ref Range   TSH  1.250 0.450 - 4.500 uIU/mL  Urinalysis, Routine w reflex microscopic  Result Value Ref Range   Specific Gravity, UA 1.015 1.005 - 1.030   pH, UA 7.0 5.0 - 7.5   Color, UA Yellow Yellow   Appearance Ur Clear Clear   Leukocytes,UA Negative Negative   Protein,UA Negative Negative/Trace   Glucose, UA Negative Negative   Ketones, UA Negative Negative   RBC, UA Negative Negative   Bilirubin, UA Negative Negative   Urobilinogen, Ur 2.0 (H) 0.2 - 1.0 mg/dL   Nitrite, UA Negative Negative   Microscopic Examination Comment       Assessment & Plan:   Problem List Items Addressed This Visit       Cardiovascular and Mediastinum   Essential hypertension    Chronic.  Controlled.  Continue with current medication regimen of Hydralazine, HCTZ, and Benazepril.  Refills sent today.  Labs ordered today.  Return to clinic in 6 months for reevaluation.  Call sooner if concerns arise.        Relevant Medications   benazepril (LOTENSIN) 40 MG tablet   hydrALAZINE (APRESOLINE) 25 MG tablet   hydrochlorothiazide (HYDRODIURIL) 25 MG tablet   rosuvastatin (CRESTOR) 10 MG tablet     Other   Mixed hyperlipidemia    Chronic.  Controlled.  Continue with current medication regimen of Crestor.  Refills sent today.  Labs ordered today.  Return to clinic in 6 months for reevaluation.  Call sooner if concerns arise.        Relevant Medications   benazepril (LOTENSIN) 40 MG tablet   hydrALAZINE (APRESOLINE) 25 MG tablet   hydrochlorothiazide (HYDRODIURIL) 25 MG tablet   rosuvastatin (CRESTOR) 10 MG tablet   Other Relevant Orders   Lipid panel   Obesity (BMI 30-39.9)    Recommended eating smaller high protein, low fat meals more frequently and exercising 30 mins a day 5 times a week with a goal of 10-15lb weight loss in the next 3 months.       Other Visit Diagnoses     Annual physical exam    -  Primary   Health maintenance reviewed during visit today.  Labs ordered.  Vaccines reviewed.  Reordered  Cologuard.  Mammogram ordered.   Relevant Orders   CBC with Differential/Platelet   Comprehensive metabolic panel   Lipid panel   TSH   Urinalysis, Routine w reflex microscopic   Screening for colon cancer       Relevant Orders   Cologuard   Encounter for screening mammogram for malignant neoplasm of breast       Relevant Orders   MM 3D SCREENING MAMMOGRAM BILATERAL BREAST        Follow up plan: Return in about 6 months (around 12/12/2022) for HTN, HLD, DM2 FU.   LABORATORY TESTING:  - Pap smear: pap done  IMMUNIZATIONS:   - Tdap: Tetanus vaccination status reviewed: last tetanus booster within 10 years. - Influenza: Refused - Pneumovax: Not applicable - Prevnar: Not applicable - HPV: Not applicable - Zostavax vaccine: Refused  SCREENING: -Mammogram: Ordered today  - Colonoscopy:  Cologuard ordered today.   - Bone Density: Not applicable  -Hearing Test: Not applicable  -Spirometry: Not applicable   PATIENT COUNSELING:   Advised to take 1 mg of folate supplement per day if capable of pregnancy.   Sexuality: Discussed sexually transmitted diseases, partner selection, use of condoms, avoidance of unintended pregnancy  and contraceptive alternatives.   Advised to avoid cigarette smoking.  I discussed with the patient that most people either abstain from alcohol or drink within safe limits (<=14/week and <=4 drinks/occasion for males, <=7/weeks and <= 3 drinks/occasion for females) and that the risk for alcohol disorders and other health effects rises proportionally with the number of drinks per week and how often a drinker exceeds daily limits.  Discussed cessation/primary prevention of drug use and availability of treatment for abuse.   Diet: Encouraged to adjust caloric intake to maintain  or achieve ideal body weight, to reduce intake of dietary saturated fat and total fat, to limit sodium intake by avoiding high sodium foods and not adding table salt, and to maintain  adequate dietary potassium and calcium preferably from fresh fruits, vegetables, and low-fat dairy products.    stressed the importance of regular exercise  Injury prevention: Discussed safety belts, safety helmets, smoke detector, smoking near bedding or upholstery.   Dental health: Discussed importance of regular tooth brushing, flossing, and dental visits.    NEXT PREVENTATIVE PHYSICAL DUE IN 1 YEAR. Return in about 6 months (around 12/12/2022) for HTN, HLD, DM2 FU.

## 2022-06-11 NOTE — Assessment & Plan Note (Signed)
Recommended eating smaller high protein, low fat meals more frequently and exercising 30 mins a day 5 times a week with a goal of 10-15lb weight loss in the next 3 months.  

## 2022-06-12 ENCOUNTER — Telehealth: Payer: Self-pay

## 2022-06-12 LAB — CBC WITH DIFFERENTIAL/PLATELET
Basophils Absolute: 0 10*3/uL (ref 0.0–0.2)
Basos: 1 %
EOS (ABSOLUTE): 0.1 10*3/uL (ref 0.0–0.4)
Eos: 3 %
Hematocrit: 39.5 % (ref 34.0–46.6)
Hemoglobin: 13.1 g/dL (ref 11.1–15.9)
Immature Grans (Abs): 0 10*3/uL (ref 0.0–0.1)
Immature Granulocytes: 0 %
Lymphocytes Absolute: 1.7 10*3/uL (ref 0.7–3.1)
Lymphs: 39 %
MCH: 29.8 pg (ref 26.6–33.0)
MCHC: 33.2 g/dL (ref 31.5–35.7)
MCV: 90 fL (ref 79–97)
Monocytes Absolute: 0.4 10*3/uL (ref 0.1–0.9)
Monocytes: 10 %
Neutrophils Absolute: 2 10*3/uL (ref 1.4–7.0)
Neutrophils: 47 %
Platelets: 325 10*3/uL (ref 150–450)
RBC: 4.4 x10E6/uL (ref 3.77–5.28)
RDW: 13.4 % (ref 11.7–15.4)
WBC: 4.3 10*3/uL (ref 3.4–10.8)

## 2022-06-12 LAB — COMPREHENSIVE METABOLIC PANEL
ALT: 25 IU/L (ref 0–32)
AST: 27 IU/L (ref 0–40)
Albumin/Globulin Ratio: 1.6 (ref 1.2–2.2)
Albumin: 4.7 g/dL (ref 3.8–4.9)
Alkaline Phosphatase: 43 IU/L — ABNORMAL LOW (ref 44–121)
BUN/Creatinine Ratio: 18 (ref 9–23)
BUN: 19 mg/dL (ref 6–24)
Bilirubin Total: 0.5 mg/dL (ref 0.0–1.2)
CO2: 25 mmol/L (ref 20–29)
Calcium: 10.1 mg/dL (ref 8.7–10.2)
Chloride: 97 mmol/L (ref 96–106)
Creatinine, Ser: 1.04 mg/dL — ABNORMAL HIGH (ref 0.57–1.00)
Globulin, Total: 2.9 g/dL (ref 1.5–4.5)
Glucose: 90 mg/dL (ref 70–99)
Potassium: 4.2 mmol/L (ref 3.5–5.2)
Sodium: 137 mmol/L (ref 134–144)
Total Protein: 7.6 g/dL (ref 6.0–8.5)
eGFR: 62 mL/min/{1.73_m2} (ref >59)

## 2022-06-12 LAB — LIPID PANEL
Chol/HDL Ratio: 2 ratio (ref 0.0–4.4)
Cholesterol, Total: 194 mg/dL (ref 100–199)
HDL: 97 mg/dL (ref >39)
LDL Chol Calc (NIH): 83 mg/dL (ref 0–99)
Triglycerides: 79 mg/dL (ref 0–149)
VLDL Cholesterol Cal: 14 mg/dL (ref 5–40)

## 2022-06-12 LAB — TSH: TSH: 2.01 u[IU]/mL (ref 0.450–4.500)

## 2022-06-12 NOTE — Progress Notes (Signed)
Please let patient know that her lab work looks good.  No concerns at this time.  Continue with current medication regimen.  Follow up as discussed.

## 2022-06-12 NOTE — Telephone Encounter (Signed)
Called and scheduled the patient's mammogram appointment for 06/25/22 at 1:40 pm at [facility] in [place].   Called and notified patient of the appointment date and time.

## 2022-06-12 NOTE — Telephone Encounter (Signed)
-----   Message from Larae Grooms, NP sent at 06/11/2022  9:01 AM EDT ----- Can we make her mammogram appt.

## 2022-07-01 ENCOUNTER — Ambulatory Visit
Admission: RE | Admit: 2022-07-01 | Discharge: 2022-07-01 | Disposition: A | Discharge status: Home / Self Care | Payer: BC Managed Care – PPO | Source: Ambulatory Visit | Attending: Nurse Practitioner | Admitting: Nurse Practitioner

## 2022-07-01 DIAGNOSIS — Z1231 Encounter for screening mammogram for malignant neoplasm of breast: Secondary | ICD-10-CM | POA: Diagnosis not present

## 2022-07-03 NOTE — Progress Notes (Signed)
Please let patient know her Mammogram did not show any evidence of a malignancy.  The recommendation is to repeat the Mammogram in 1 year.  

## 2022-11-07 IMAGING — MG MM DIGITAL SCREENING BILAT W/ TOMO AND CAD
8 series · 8 of 24 positions shown · non-contrast
Comparison: None.

CLINICAL DATA: Screening.

EXAM:
DIGITAL SCREENING BILATERAL MAMMOGRAM WITH TOMOSYNTHESIS AND CAD
TECHNIQUE: Bilateral screening digital craniocaudal and mediolateral oblique
mammograms were obtained. Bilateral screening digital breast
tomosynthesis was performed. The images were evaluated with
computer-aided detection.

[L MLO synth-2D]
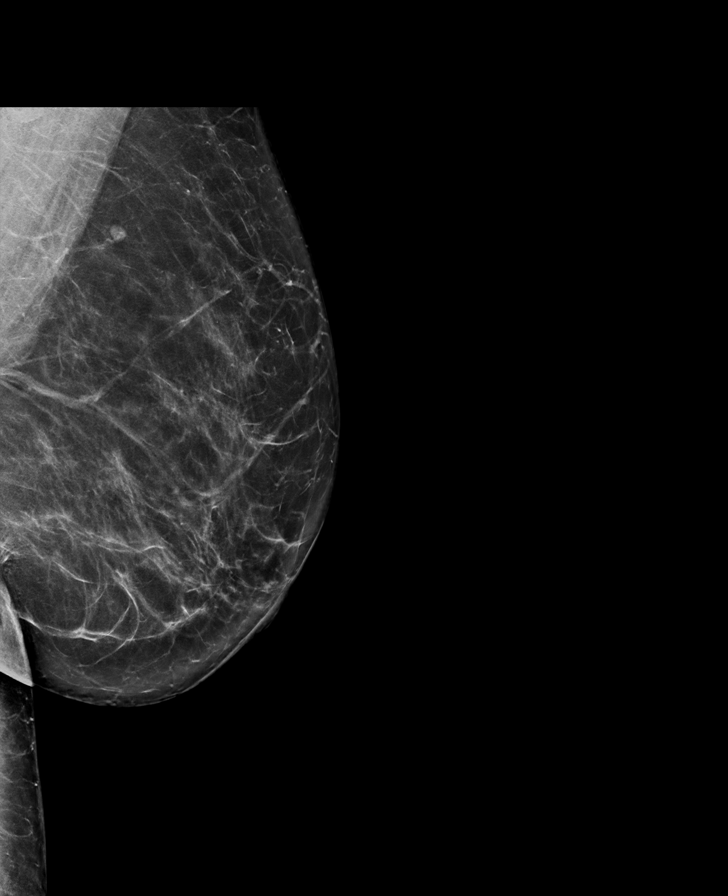

[R CC synth-2D]
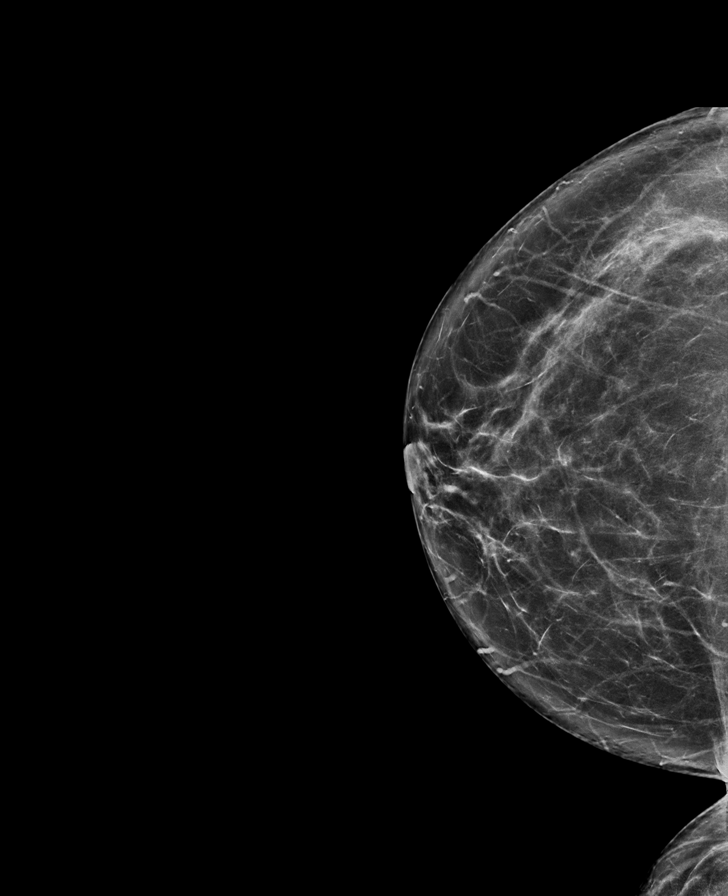

[R MLO synth-2D]
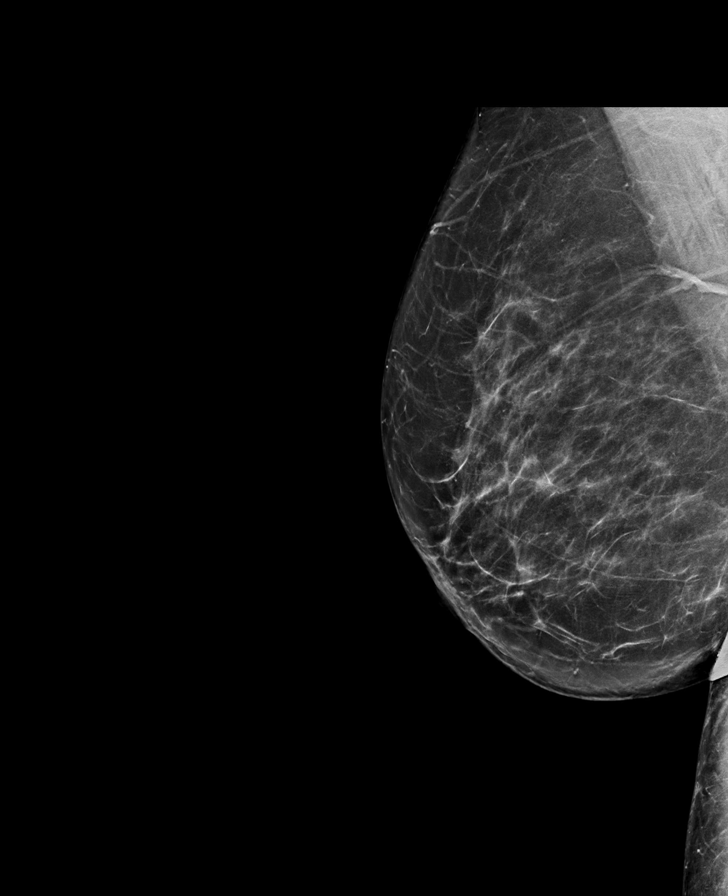

[L CC synth-2D]
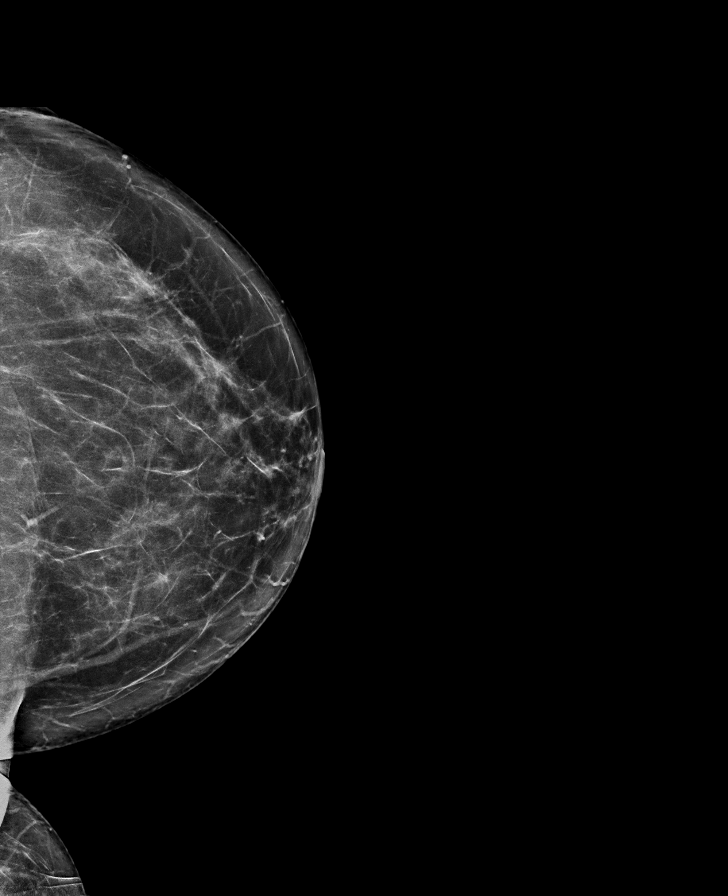

[R MLO tomo · tomo slice 41/81.0]
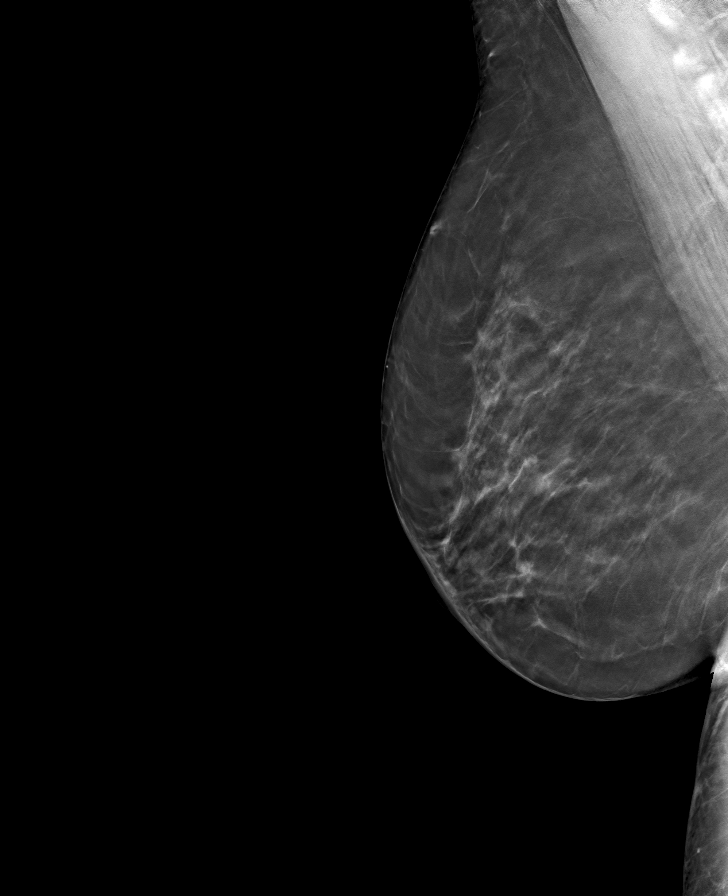

[L MLO tomo · tomo slice 41/82.0]
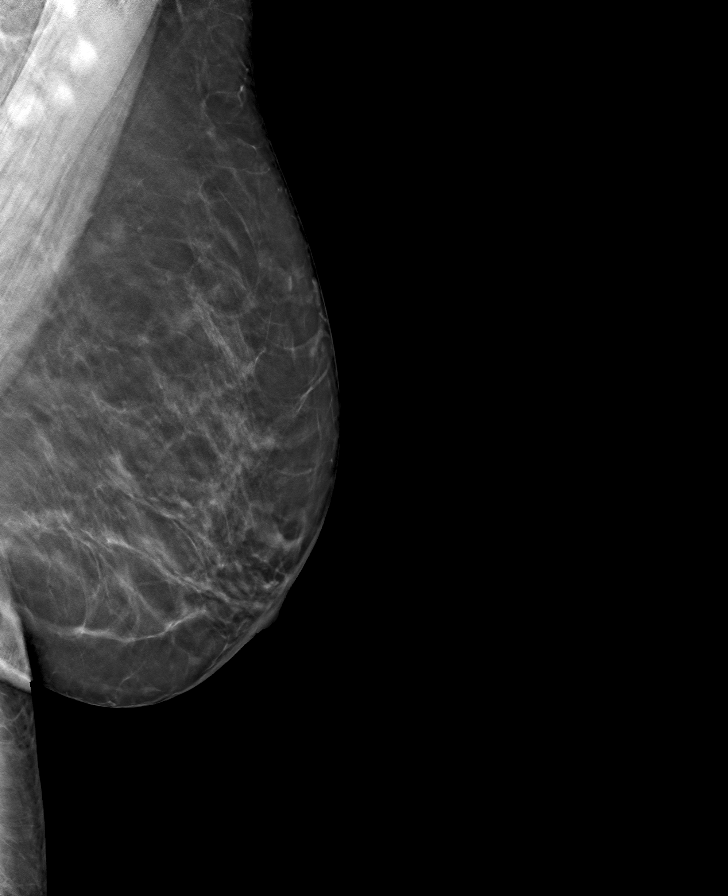

[R CC tomo · tomo slice 41/80.0]
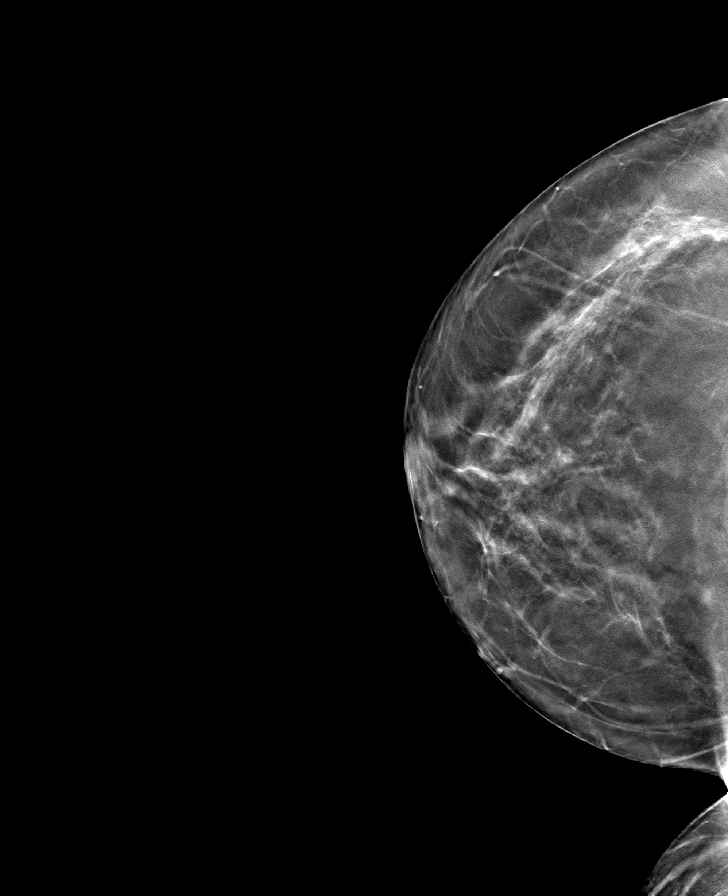

[L CC tomo · tomo slice 41/81.0]
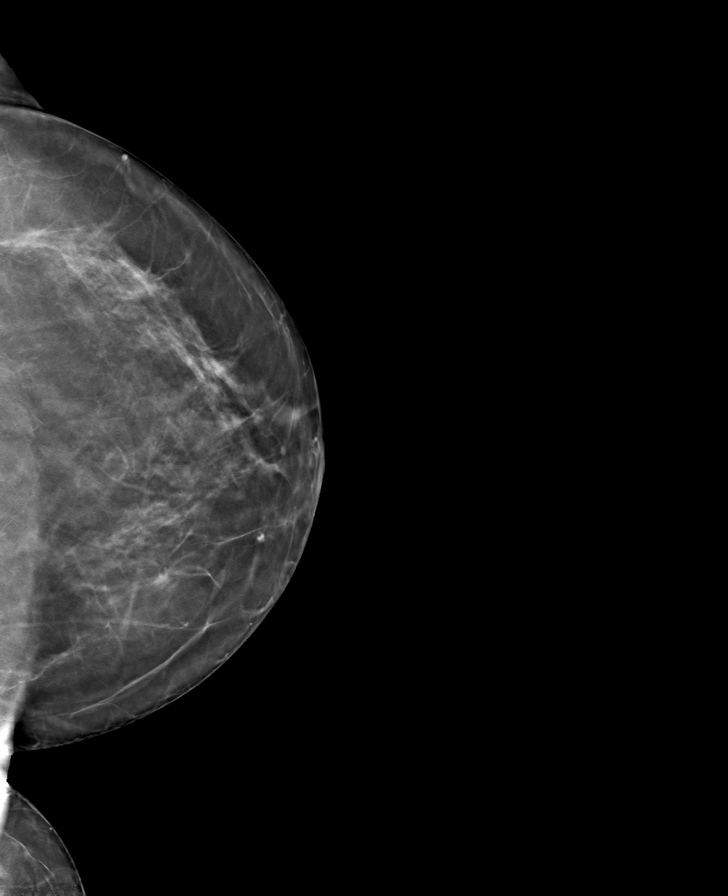

[8 of 24 positions shown; findings below may reference images not displayed]

ACR Breast Density Category b: There are scattered areas of
fibroglandular density.
FINDINGS: There are no findings suspicious for malignancy.
IMPRESSION: No mammographic evidence of malignancy. A result letter of this
screening mammogram will be mailed directly to the patient.

RECOMMENDATION:
Screening mammogram in one year. (Code:XG-X-X7B)

BI-RADS CATEGORY  1: Negative.

## 2022-12-16 ENCOUNTER — Ambulatory Visit: Payer: BC Managed Care – PPO | Admitting: Nurse Practitioner

## 2022-12-26 ENCOUNTER — Ambulatory Visit (INDEPENDENT_AMBULATORY_CARE_PROVIDER_SITE_OTHER): Payer: BC Managed Care – PPO | Admitting: Nurse Practitioner

## 2022-12-26 ENCOUNTER — Encounter: Payer: Self-pay | Admitting: Nurse Practitioner

## 2022-12-26 VITALS — BP 134/82 | HR 85 | Temp 98.1°F | Ht 65.0 in | Wt 188.0 lb

## 2022-12-26 DIAGNOSIS — E669 Obesity, unspecified: Secondary | ICD-10-CM | POA: Diagnosis not present

## 2022-12-26 DIAGNOSIS — E782 Mixed hyperlipidemia: Secondary | ICD-10-CM | POA: Diagnosis not present

## 2022-12-26 DIAGNOSIS — Z1211 Encounter for screening for malignant neoplasm of colon: Secondary | ICD-10-CM

## 2022-12-26 DIAGNOSIS — I10 Essential (primary) hypertension: Secondary | ICD-10-CM

## 2022-12-26 MED ORDER — ROSUVASTATIN CALCIUM 20 MG PO TABS: 20.0000 mg | ORAL_TABLET | Freq: Every day | ORAL | 1 refills | Status: DC

## 2022-12-26 MED ORDER — HYDRALAZINE HCL 25 MG PO TABS: 25.0000 mg | ORAL_TABLET | Freq: Three times a day (TID) | ORAL | 1 refills | Status: DC

## 2022-12-26 MED ORDER — BENAZEPRIL HCL 40 MG PO TABS: 40.0000 mg | ORAL_TABLET | Freq: Every day | ORAL | 1 refills | Status: DC

## 2022-12-26 MED ORDER — HYDROCHLOROTHIAZIDE 25 MG PO TABS: 25.0000 mg | ORAL_TABLET | Freq: Every day | ORAL | 1 refills | Status: DC

## 2022-12-26 NOTE — Assessment & Plan Note (Signed)
Chronic.  Rosuvastatin increased to 20mg .  Labs ordered today. Will make recommendations based on lab results.

## 2022-12-26 NOTE — Assessment & Plan Note (Signed)
Recommended eating smaller high protein, low fat meals more frequently and exercising 30 mins a day 5 times a week with a goal of 10-15lb weight loss in the next 3 months.  

## 2022-12-26 NOTE — Assessment & Plan Note (Signed)
Chronic.  Controlled.  Continue with current medication regimen of Hydralazine, HCTZ, and Benazepril.  Continue to check blood pressures at home and bring log to next visit.  Refills sent today.  Labs ordered today.  Return to clinic in 6 months for reevaluation.  Call sooner if concerns arise.

## 2022-12-26 NOTE — Progress Notes (Signed)
BP 134/82 Comment: Home blood pressure reading  Pulse 85   Temp 98.1 F (36.7 C) (Oral)   Ht 5\' 5"  (1.651 m)   Wt 188 lb (85.3 kg)   LMP 11/16/2017 (Exact Date)   SpO2 98%   BMI 31.28 kg/m    Subjective:    Patient ID: Nicole Cortez, female    DOB: 1965-09-18, 57 y.o.   MRN: [id number]  HPI: Nicole Cortez is a 57 y.o. female  Chief Complaint  Patient presents with   6 month follow up   Hypertension   HYPERTENSION / HYPERLIPIDEMIA Satisfied with current treatment? yes Duration of hypertension: years BP monitoring frequency: daily BP range: 130/80 BP medication side effects: no Past BP meds: hydralazine, benazepril, and HCTZ Duration of hyperlipidemia: years Cholesterol medication side effects: no Cholesterol supplements: none Past cholesterol medications: rosuvastatin (crestor) Medication compliance: excellent compliance Aspirin: no Recent stressors: no Recurrent headaches: no Visual changes: no Palpitations: no Dyspnea: no Chest pain: no Lower extremity edema: no Dizzy/lightheaded: no   Relevant past medical, surgical, family and social history reviewed and updated as indicated. Interim medical history since our last visit reviewed. Allergies and medications reviewed and updated.  Review of Systems  Eyes:  Negative for visual disturbance.  Respiratory:  Negative for cough, chest tightness and shortness of breath.   Cardiovascular:  Negative for chest pain, palpitations and leg swelling.  Neurological:  Negative for dizziness and headaches.    Per HPI unless specifically indicated above     Objective:    BP 134/82 Comment: Home blood pressure reading  Pulse 85   Temp 98.1 F (36.7 C) (Oral)   Ht 5\' 5"  (1.651 m)   Wt 188 lb (85.3 kg)   LMP 11/16/2017 (Exact Date)   SpO2 98%   BMI 31.28 kg/m   Wt Readings from Last 3 Encounters:  12/26/22 188 lb (85.3 kg)  06/11/22 193 lb 3.2 oz (87.6 kg)  12/11/21 194 lb 14.4 oz (88.4 kg)    Physical  Exam Vitals and nursing note reviewed.  Constitutional:      General: She is not in acute distress.    Appearance: Normal appearance. She is normal weight. She is not ill-appearing, toxic-appearing or diaphoretic.  HENT:     Head: Normocephalic.     Right Ear: External ear normal.     Left Ear: External ear normal.     Nose: Nose normal.     Mouth/Throat:     Mouth: Mucous membranes are moist.     Pharynx: Oropharynx is clear.  Eyes:     General:        Right eye: No discharge.        Left eye: No discharge.     Extraocular Movements: Extraocular movements intact.     Conjunctiva/sclera: Conjunctivae normal.     Pupils: Pupils are equal, round, and reactive to light.  Cardiovascular:     Rate and Rhythm: Normal rate and regular rhythm.     Heart sounds: No murmur heard. Pulmonary:     Effort: Pulmonary effort is normal. No respiratory distress.     Breath sounds: Normal breath sounds. No wheezing or rales.  Musculoskeletal:     Cervical back: Normal range of motion and neck supple.  Skin:    General: Skin is warm and dry.     Capillary Refill: Capillary refill takes less than 2 seconds.  Neurological:     General: No focal deficit present.  Mental Status: She is alert and oriented to person, place, and time. Mental status is at baseline.  Psychiatric:        Mood and Affect: Mood normal.        Behavior: Behavior normal.        Thought Content: Thought content normal.        Judgment: Judgment normal.     Results for orders placed or performed in visit on 06/11/22  Urinalysis, Routine w reflex microscopic   Collection Time: 06/11/22  8:59 AM  Result Value Ref Range   Specific Gravity, UA 1.025 1.005 - 1.030   pH, UA 5.5 5.0 - 7.5   Color, UA Yellow Yellow   Appearance Ur Clear Clear   Leukocytes,UA Negative Negative   Protein,UA Negative Negative/Trace   Glucose, UA Negative Negative   Ketones, UA Negative Negative   RBC, UA Negative Negative   Bilirubin, UA  Negative Negative   Urobilinogen, Ur 0.2 0.2 - 1.0 mg/dL   Nitrite, UA Negative Negative   Microscopic Examination Comment   CBC with Differential/Platelet   Collection Time: 06/11/22  9:01 AM  Result Value Ref Range   WBC 4.3 3.4 - 10.8 x10E3/uL   RBC 4.40 3.77 - 5.28 x10E6/uL   Hemoglobin 13.1 11.1 - 15.9 g/dL   Hematocrit 63.8 75.6 - 46.6 %   MCV 90 79 - 97 fL   MCH 29.8 26.6 - 33.0 pg   MCHC 33.2 31.5 - 35.7 g/dL   RDW 43.3 29.5 - 18.8 %   Platelets 325 150 - 450 x10E3/uL   Neutrophils 47 Not Estab. %   Lymphs 39 Not Estab. %   Monocytes 10 Not Estab. %   Eos 3 Not Estab. %   Basos 1 Not Estab. %   Neutrophils Absolute 2.0 1.4 - 7.0 x10E3/uL   Lymphocytes Absolute 1.7 0.7 - 3.1 x10E3/uL   Monocytes Absolute 0.4 0.1 - 0.9 x10E3/uL   EOS (ABSOLUTE) 0.1 0.0 - 0.4 x10E3/uL   Basophils Absolute 0.0 0.0 - 0.2 x10E3/uL   Immature Granulocytes 0 Not Estab. %   Immature Grans (Abs) 0.0 0.0 - 0.1 x10E3/uL  Comprehensive metabolic panel   Collection Time: 06/11/22  9:01 AM  Result Value Ref Range   Glucose 90 70 - 99 mg/dL   BUN 19 6 - 24 mg/dL   Creatinine, Ser 4.16 (H) 0.57 - 1.00 mg/dL   eGFR 62 >60 YT/KZS/0.10   BUN/Creatinine Ratio 18 9 - 23   Sodium 137 134 - 144 mmol/L   Potassium 4.2 3.5 - 5.2 mmol/L   Chloride 97 96 - 106 mmol/L   CO2 25 20 - 29 mmol/L   Calcium 10.1 8.7 - 10.2 mg/dL   Total Protein 7.6 6.0 - 8.5 g/dL   Albumin 4.7 3.8 - 4.9 g/dL   Globulin, Total 2.9 1.5 - 4.5 g/dL   Albumin/Globulin Ratio 1.6 1.2 - 2.2   Bilirubin Total 0.5 0.0 - 1.2 mg/dL   Alkaline Phosphatase 43 (L) 44 - 121 IU/L   AST 27 0 - 40 IU/L   ALT 25 0 - 32 IU/L  Lipid panel   Collection Time: 06/11/22  9:01 AM  Result Value Ref Range   Cholesterol, Total 194 100 - 199 mg/dL   Triglycerides 79 0 - 149 mg/dL   HDL 97 >93 mg/dL   VLDL Cholesterol Cal 14 5 - 40 mg/dL   LDL Chol Calc (NIH) 83 0 - 99 mg/dL   Chol/HDL Ratio 2.0 0.0 -  4.4 ratio  TSH   Collection Time: 06/11/22  9:01  AM  Result Value Ref Range   TSH 2.010 0.450 - 4.500 uIU/mL      Assessment & Plan:   Problem List Items Addressed This Visit       Cardiovascular and Mediastinum   Essential hypertension - Primary   Chronic.  Controlled.  Continue with current medication regimen of Hydralazine, HCTZ, and Benazepril.  Continue to check blood pressures at home and bring log to next visit.  Refills sent today.  Labs ordered today.  Return to clinic in 6 months for reevaluation.  Call sooner if concerns arise.       Relevant Medications   rosuvastatin (CRESTOR) 20 MG tablet   benazepril (LOTENSIN) 40 MG tablet   hydrALAZINE (APRESOLINE) 25 MG tablet   hydrochlorothiazide (HYDRODIURIL) 25 MG tablet   Other Relevant Orders   Comp Met (CMET)     Other   Mixed hyperlipidemia   Chronic.  Rosuvastatin increased to 20mg .  Labs ordered today. Will make recommendations based on lab results.      Relevant Medications   rosuvastatin (CRESTOR) 20 MG tablet   benazepril (LOTENSIN) 40 MG tablet   hydrALAZINE (APRESOLINE) 25 MG tablet   hydrochlorothiazide (HYDRODIURIL) 25 MG tablet   Other Relevant Orders   Lipid Profile   Obesity (BMI 30-39.9)   Recommended eating smaller high protein, low fat meals more frequently and exercising 30 mins a day 5 times a week with a goal of 10-15lb weight loss in the next 3 months.       Other Visit Diagnoses       Screening for colon cancer       Relevant Orders   Cologuard        Follow up plan: Return in about 6 months (around 06/26/2023) for Physical and Fasting labs.

## 2022-12-27 LAB — COMPREHENSIVE METABOLIC PANEL
ALT: 23 [IU]/L (ref 0–32)
AST: 29 [IU]/L (ref 0–40)
Albumin: 4.7 g/dL (ref 3.8–4.9)
Alkaline Phosphatase: 41 [IU]/L — ABNORMAL LOW (ref 44–121)
BUN/Creatinine Ratio: 19 (ref 9–23)
BUN: 24 mg/dL (ref 6–24)
Bilirubin Total: 0.2 mg/dL (ref 0.0–1.2)
CO2: 23 mmol/L (ref 20–29)
Calcium: 9.9 mg/dL (ref 8.7–10.2)
Chloride: 104 mmol/L (ref 96–106)
Creatinine, Ser: 1.25 mg/dL — ABNORMAL HIGH (ref 0.57–1.00)
Globulin, Total: 2.9 g/dL (ref 1.5–4.5)
Glucose: 102 mg/dL — ABNORMAL HIGH (ref 70–99)
Potassium: 4 mmol/L (ref 3.5–5.2)
Sodium: 143 mmol/L (ref 134–144)
Total Protein: 7.6 g/dL (ref 6.0–8.5)
eGFR: 50 mL/min/{1.73_m2} — ABNORMAL LOW (ref >59)

## 2022-12-27 LAB — LIPID PANEL
Chol/HDL Ratio: 2.1 {ratio} (ref 0.0–4.4)
Cholesterol, Total: 180 mg/dL (ref 100–199)
HDL: 86 mg/dL (ref >39)
LDL Chol Calc (NIH): 77 mg/dL (ref 0–99)
Triglycerides: 95 mg/dL (ref 0–149)
VLDL Cholesterol Cal: 17 mg/dL (ref 5–40)

## 2022-12-29 ENCOUNTER — Other Ambulatory Visit: Payer: Self-pay | Admitting: Nurse Practitioner

## 2022-12-31 NOTE — Telephone Encounter (Signed)
Requested Prescriptions  Refused Prescriptions Disp Refills   rosuvastatin (CRESTOR) 10 MG tablet [Pharmacy Med Name: ROSUVASTATIN CALCIUM 10 MG TAB] 90 tablet 1    Sig: TAKE 1 TABLET BY MOUTH EVERY DAY     Cardiovascular:  Antilipid - Statins 2 Failed - 12/31/2022  7:32 AM      Failed - Cr in normal range and within 360 days    Creatinine, Ser  Date Value Ref Range Status  12/26/2022 1.25 (H) 0.57 - 1.00 mg/dL Final         Failed - Lipid Panel in normal range within the last 12 months    Cholesterol, Total  Date Value Ref Range Status  12/26/2022 180 100 - 199 mg/dL Final   LDL Chol Calc (NIH)  Date Value Ref Range Status  12/26/2022 77 0 - 99 mg/dL Final   HDL  Date Value Ref Range Status  12/26/2022 86 >39 mg/dL Final   Triglycerides  Date Value Ref Range Status  12/26/2022 95 0 - 149 mg/dL Final         Passed - Patient is not pregnant      Passed - Valid encounter within last 12 months    Recent Outpatient Visits           5 days ago Essential hypertension   [place] [facility] Larae Grooms, NP   6 months ago Annual physical exam   [place] [facility] Larae Grooms, NP   1 year ago Essential hypertension   [place] [facility] Larae Grooms, NP   1 year ago Essential hypertension   [place] Larae Grooms, NP   1 year ago Essential hypertension   [place] [facility] Larae Grooms, NP       Future Appointments             In 5 months Larae Grooms, NP [facility]

## 2023-03-10 ENCOUNTER — Other Ambulatory Visit: Payer: Self-pay | Admitting: Nurse Practitioner

## 2023-03-10 NOTE — Telephone Encounter (Signed)
 Copied from CRM [id number]. Topic: Clinical - Medication Refill >> Mar 10, 2023  2:15 PM Alessandra Bevels wrote: Most Recent Primary Care Visit:  Provider: Larae Grooms  Department: [id number]  Visit Type: OFFICE VISIT  Date: 12/26/2022  Medication: Medication benazepril (LOTENSIN) 40 MG tablet [9222] hydrochlorothiazide (HYDRODIURIL) 25 MG tablet [3720] rosuvastatin (CRESTOR) 20 MG tablet [[id number]]  Has the patient contacted their pharmacy? Yes (Agent: If no, request that the patient contact the pharmacy for the refill. If patient does not wish to contact the pharmacy document the reason why and proceed with request.) (Agent: If yes, when and what did the pharmacy advise?)  Is this the correct pharmacy for this prescription? Yes If no, delete pharmacy and type the correct one.  This is the patient's preferred pharmacy:  [facility] [address], [place] - [address] [place] [address] Phone: [phone] Fax: [phone]   Has the prescription been filled recently? Yes  Is the patient out of the medication? Yes  Has the patient been seen for an appointment in the last year OR does the patient have an upcoming appointment? Yes  Can we respond through MyChart? Yes  Agent: Please be advised that Rx refills may take up to 3 business days. We ask that you follow-up with your pharmacy.

## 2023-06-05 ENCOUNTER — Other Ambulatory Visit: Payer: Self-pay | Admitting: Nurse Practitioner

## 2023-06-06 NOTE — Telephone Encounter (Signed)
 Requested Prescriptions  Pending Prescriptions Disp Refills   rosuvastatin  (CRESTOR ) 20 MG tablet [Pharmacy Med Name: ROSUVASTATIN  CALCIUM  20 MG TAB] 90 tablet 0    Sig: TAKE 1 TABLET BY MOUTH EVERY DAY     Cardiovascular:  Antilipid - Statins 2 Failed - 06/06/2023 10:35 AM      Failed - Cr in normal range and within 360 days    Creatinine, Ser  Date Value Ref Range Status  12/26/2022 1.25 (H) 0.57 - 1.00 mg/dL Final         Failed - Valid encounter within last 12 months    Recent Outpatient Visits   None     Future Appointments             In 2 weeks Aileen Alexanders, NP [facility]            Failed - Lipid Panel in normal range within the last 12 months    Cholesterol, Total  Date Value Ref Range Status  12/26/2022 180 100 - 199 mg/dL Final   LDL Chol Calc (NIH)  Date Value Ref Range Status  12/26/2022 77 0 - 99 mg/dL Final   HDL  Date Value Ref Range Status  12/26/2022 86 >39 mg/dL Final   Triglycerides  Date Value Ref Range Status  12/26/2022 95 0 - 149 mg/dL Final         Passed - Patient is not pregnant       hydrochlorothiazide  (HYDRODIURIL ) 25 MG tablet [Pharmacy Med Name: HYDROCHLOROTHIAZIDE  25 MG TAB] 90 tablet 0    Sig: TAKE 1 TABLET (25 MG TOTAL) BY MOUTH DAILY.     Cardiovascular: Diuretics - Thiazide Failed - 06/06/2023 10:35 AM      Failed - Cr in normal range and within 180 days    Creatinine, Ser  Date Value Ref Range Status  12/26/2022 1.25 (H) 0.57 - 1.00 mg/dL Final         Failed - Valid encounter within last 6 months    Recent Outpatient Visits   None     Future Appointments             In 2 weeks Aileen Alexanders, NP [facility]            Passed - K in normal range and within 180 days    Potassium  Date Value Ref Range Status  12/26/2022 4.0 3.5 - 5.2 mmol/L Final         Passed - Na in normal range and within 180 days    Sodium  Date Value Ref Range  Status  12/26/2022 143 134 - 144 mmol/L Final         Passed - Last BP in normal range    BP Readings from Last 1 Encounters:  12/26/22 134/82          hydrALAZINE  (APRESOLINE ) 25 MG tablet [Pharmacy Med Name: HYDRALAZINE  25 MG TABLET] 270 tablet 0    Sig: TAKE 1 TABLET BY MOUTH THREE TIMES A DAY     Cardiovascular:  Vasodilators Failed - 06/06/2023 10:35 AM      Failed - ANA Screen, Ifa, Serum in normal range and within 360 days    No results found for: "ANA", "ANATITER", "LABANTI"       Failed - Valid encounter within last 12 months    Recent Outpatient Visits   None     Future Appointments  In 2 weeks Aileen Alexanders, NP [facility]            Passed - HCT in normal range and within 360 days    Hematocrit  Date Value Ref Range Status  06/11/2022 39.5 34.0 - 46.6 % Final         Passed - HGB in normal range and within 360 days    Hemoglobin  Date Value Ref Range Status  06/11/2022 13.1 11.1 - 15.9 g/dL Final         Passed - RBC in normal range and within 360 days    RBC  Date Value Ref Range Status  06/11/2022 4.40 3.77 - 5.28 x10E6/uL Final         Passed - WBC in normal range and within 360 days    WBC  Date Value Ref Range Status  06/11/2022 4.3 3.4 - 10.8 x10E3/uL Final         Passed - PLT in normal range and within 360 days    Platelets  Date Value Ref Range Status  06/11/2022 325 150 - 450 x10E3/uL Final         Passed - Last BP in normal range    BP Readings from Last 1 Encounters:  12/26/22 134/82          benazepril  (LOTENSIN ) 40 MG tablet [Pharmacy Med Name: BENAZEPRIL  HCL 40 MG TABLET] 90 tablet 0    Sig: TAKE 1 TABLET BY MOUTH EVERY DAY     Cardiovascular:  ACE Inhibitors Failed - 06/06/2023 10:35 AM      Failed - Cr in normal range and within 180 days    Creatinine, Ser  Date Value Ref Range Status  12/26/2022 1.25 (H) 0.57 - 1.00 mg/dL Final         Failed - Valid encounter within  last 6 months    Recent Outpatient Visits   None     Future Appointments             In 2 weeks Aileen Alexanders, NP [facility]            Passed - K in normal range and within 180 days    Potassium  Date Value Ref Range Status  12/26/2022 4.0 3.5 - 5.2 mmol/L Final         Passed - Patient is not pregnant      Passed - Last BP in normal range    BP Readings from Last 1 Encounters:  12/26/22 134/82

## 2023-06-25 ENCOUNTER — Encounter: Payer: Self-pay | Admitting: Nurse Practitioner

## 2023-06-25 ENCOUNTER — Ambulatory Visit: Payer: Self-pay | Admitting: Nurse Practitioner

## 2023-06-25 VITALS — BP 137/80 | HR 69 | Temp 98.9°F | Resp 15 | Ht 65.0 in | Wt 176.4 lb

## 2023-06-25 DIAGNOSIS — E782 Mixed hyperlipidemia: Secondary | ICD-10-CM

## 2023-06-25 DIAGNOSIS — Z0000 Encounter for general adult medical examination without abnormal findings: Secondary | ICD-10-CM

## 2023-06-25 DIAGNOSIS — Z1211 Encounter for screening for malignant neoplasm of colon: Secondary | ICD-10-CM | POA: Diagnosis not present

## 2023-06-25 DIAGNOSIS — I10 Essential (primary) hypertension: Secondary | ICD-10-CM

## 2023-06-25 MED ORDER — ROSUVASTATIN CALCIUM 20 MG PO TABS: 20.0000 mg | ORAL_TABLET | Freq: Every day | ORAL | 1 refills | Status: DC

## 2023-06-25 MED ORDER — HYDRALAZINE HCL 25 MG PO TABS: 25.0000 mg | ORAL_TABLET | Freq: Three times a day (TID) | ORAL | 1 refills | Status: DC

## 2023-06-25 MED ORDER — BENAZEPRIL HCL 40 MG PO TABS: 40.0000 mg | ORAL_TABLET | Freq: Every day | ORAL | 1 refills | Status: DC

## 2023-06-25 MED ORDER — HYDROCHLOROTHIAZIDE 25 MG PO TABS: 25.0000 mg | ORAL_TABLET | Freq: Every day | ORAL | 1 refills | Status: DC

## 2023-06-25 NOTE — Assessment & Plan Note (Signed)
 Chronic.  Continue with Rosuvastatin  20mg  daily.  Refills sent today.  Labs ordered today. Will make recommendations based on lab results.

## 2023-06-25 NOTE — Progress Notes (Signed)
 BP 137/80 (BP Location: Right Arm, Patient Position: Sitting, Cuff Size: Large)   Pulse 69   Temp 98.9 F (37.2 C) (Oral)   Resp 15   Ht 5' 5 (1.651 m)   Wt 176 lb 6.4 oz (80 kg)   LMP 11/16/2017 (Exact Date)   SpO2 97%   BMI 29.35 kg/m    Subjective:    Patient ID: Nicole Cortez, female    DOB: 02-16-1965, 58 y.o.   MRN: [id number]  HPI: Nicole Cortez is a 58 y.o. female presenting on 06/25/2023 for comprehensive medical examination. Current medical complaints include:none  She currently lives with: Menopausal Symptoms: no  HYPERTENSION Hypertension status: controlled Satisfied with current treatment? no Duration of hypertension: years BP monitoring frequency:  checking at work BP range: 138/60 BP medication side effects:  no Medication compliance: excellent compliance Previous BP meds:benazepril /HCTZ, hydralazine  Aspirin: no Recurrent headaches: no Visual changes: no Palpitations: no Dyspnea: no Chest pain: no Lower extremity edema: no Dizzy/lightheaded: no  Depression Screen done today and results listed below:     06/25/2023    9:08 AM 06/11/2022    8:44 AM 12/11/2021    9:20 AM 07/11/2021    2:15 PM 04/10/2021    9:55 AM  Depression screen PHQ 2/9  Decreased Interest 0 0 0 0 0  Down, Depressed, Hopeless 0 0 0 0 0  PHQ - 2 Score 0 0 0 0 0  Altered sleeping 0 0 1 0 0  Tired, decreased energy 0 0 1 1 0  Change in appetite 0 0 0 0 0  Feeling bad or failure about yourself  0 0 0 0 0  Trouble concentrating 0 0 0 0 0  Moving slowly or fidgety/restless 0 0 0 0 0  Suicidal thoughts 0 0 0 0 0  PHQ-9 Score 0 0 2 1 0  Difficult doing work/chores  Not difficult at all  Not difficult at all Not difficult at all    The patient does not have a history of falls. I did complete a risk assessment for falls. A plan of care for falls was documented.   Past Medical History:  Past Medical History:  Diagnosis Date   Hypertension     Surgical History:  Past  Surgical History:  Procedure Laterality Date   TUBAL LIGATION      Medications:  Current Outpatient Medications on File Prior to Visit  Medication Sig   Multiple Vitamins-Minerals (CENTRUM SILVER 50+WOMEN) TABS Take 1 each by mouth daily.   No current facility-administered medications on file prior to visit.    Allergies:  Allergies  Allergen Reactions   Amlodipine  Swelling    Gums swell    Social History:  Social History   Socioeconomic History   Marital status: Single    Spouse name: Not on file   Number of children: Not on file   Years of education: Not on file   Highest education level: Not on file  Occupational History   Not on file  Tobacco Use   Smoking status: Never   Smokeless tobacco: Never  Vaping Use   Vaping status: Never Used  Substance and Sexual Activity   Alcohol use: Yes    Alcohol/week: 3.0 standard drinks of alcohol    Types: 3 Cans of beer per week    Comment: On Weekends   Drug use: Never   Sexual activity: Yes  Other Topics Concern   Not on file  Social History Narrative  Not on file   Social Drivers of Health   Financial Resource Strain: Low Risk  (06/25/2023)   Overall Financial Resource Strain (CARDIA)    Difficulty of Paying Living Expenses: Not hard at all  Food Insecurity: No Food Insecurity (06/25/2023)   Hunger Vital Sign    Worried About Running Out of Food in the Last Year: Never true    Ran Out of Food in the Last Year: Never true  Transportation Needs: No Transportation Needs (06/25/2023)   PRAPARE - Administrator, Civil Service (Medical): No    Lack of Transportation (Non-Medical): No  Physical Activity: Sufficiently Active (06/25/2023)   Exercise Vital Sign    Days of Exercise per Week: 5 days    Minutes of Exercise per Session: 60 min  Stress: No Stress Concern Present (06/25/2023)   [employer] of Occupational Health - Occupational Stress Questionnaire    Feeling of Stress : Not at all  Social  Connections: Moderately Isolated (06/25/2023)   Social Connection and Isolation Panel [NHANES]    Frequency of Communication with Friends and Family: More than three times a week    Frequency of Social Gatherings with Friends and Family: Three times a week    Attends Religious Services: 1 to 4 times per year    Active Member of Clubs or Organizations: No    Attends Banker Meetings: Never    Marital Status: Widowed  Intimate Partner Violence: Not At Risk (06/25/2023)   Humiliation, Afraid, Rape, and Kick questionnaire    Fear of Current or Ex-Partner: No    Emotionally Abused: No    Physically Abused: No    Sexually Abused: No   Social History   Tobacco Use  Smoking Status Never  Smokeless Tobacco Never   Social History   Substance and Sexual Activity  Alcohol Use Yes   Alcohol/week: 3.0 standard drinks of alcohol   Types: 3 Cans of beer per week   Comment: On Weekends    Family History:  Family History  Problem Relation Age of Onset   Hypertension Mother    Kidney disease Mother    Hypertension Maternal Aunt    Kidney disease Maternal Aunt    Hypertension Maternal Uncle    Kidney disease Maternal Uncle    Kidney disease Maternal Grandmother     Past medical history, surgical history, medications, allergies, family history and social history reviewed with patient today and changes made to appropriate areas of the chart.   Review of Systems  Eyes:  Negative for blurred vision and double vision.  Respiratory:  Negative for shortness of breath.   Cardiovascular:  Negative for chest pain, palpitations and leg swelling.  Neurological:  Negative for dizziness and headaches.   All other ROS negative except what is listed above and in the HPI.      Objective:    BP 137/80 (BP Location: Right Arm, Patient Position: Sitting, Cuff Size: Large)   Pulse 69   Temp 98.9 F (37.2 C) (Oral)   Resp 15   Ht 5' 5 (1.651 m)   Wt 176 lb 6.4 oz (80 kg)   LMP  11/16/2017 (Exact Date)   SpO2 97%   BMI 29.35 kg/m   Wt Readings from Last 3 Encounters:  06/25/23 176 lb 6.4 oz (80 kg)  12/26/22 188 lb (85.3 kg)  06/11/22 193 lb 3.2 oz (87.6 kg)    Physical Exam Vitals and nursing note reviewed. Exam conducted with a chaperone  present Eugene Hertz, CMA).  Constitutional:      General: She is awake. She is not in acute distress.    Appearance: Normal appearance. She is well-developed and normal weight. She is not ill-appearing.  HENT:     Head: Normocephalic and atraumatic.     Right Ear: Hearing, tympanic membrane, ear canal and external ear normal. No drainage.     Left Ear: Hearing, tympanic membrane, ear canal and external ear normal. No drainage.     Nose: Nose normal.     Right Sinus: No maxillary sinus tenderness or frontal sinus tenderness.     Left Sinus: No maxillary sinus tenderness or frontal sinus tenderness.     Mouth/Throat:     Mouth: Mucous membranes are moist.     Pharynx: Oropharynx is clear. Uvula midline. No pharyngeal swelling, oropharyngeal exudate or posterior oropharyngeal erythema.  Eyes:     General: Lids are normal.        Right eye: No discharge.        Left eye: No discharge.     Extraocular Movements: Extraocular movements intact.     Conjunctiva/sclera: Conjunctivae normal.     Pupils: Pupils are equal, round, and reactive to light.     Visual Fields: Right eye visual fields normal and left eye visual fields normal.  Neck:     Thyroid: No thyromegaly.     Vascular: No carotid bruit.     Trachea: Trachea normal.  Cardiovascular:     Rate and Rhythm: Normal rate and regular rhythm.     Heart sounds: Normal heart sounds. No murmur heard.    No gallop.  Pulmonary:     Effort: Pulmonary effort is normal. No accessory muscle usage or respiratory distress.     Breath sounds: Normal breath sounds.  Chest:  Breasts:    Right: Normal.     Left: Normal.  Abdominal:     General: Bowel sounds are normal.      Palpations: Abdomen is soft. There is no hepatomegaly or splenomegaly.     Tenderness: There is no abdominal tenderness.  Genitourinary:    Vagina: Normal.     Cervix: Normal.     Adnexa: Right adnexa normal and left adnexa normal.  Musculoskeletal:        General: Normal range of motion.     Cervical back: Normal range of motion and neck supple.     Right lower leg: No edema.     Left lower leg: No edema.  Lymphadenopathy:     Head:     Right side of head: No submental, submandibular, tonsillar, preauricular or posterior auricular adenopathy.     Left side of head: No submental, submandibular, tonsillar, preauricular or posterior auricular adenopathy.     Cervical: No cervical adenopathy.     Upper Body:     Right upper body: No supraclavicular, axillary or pectoral adenopathy.     Left upper body: No supraclavicular, axillary or pectoral adenopathy.  Skin:    General: Skin is warm and dry.     Capillary Refill: Capillary refill takes less than 2 seconds.     Findings: No rash.  Neurological:     Mental Status: She is alert and oriented to person, place, and time.     Gait: Gait is intact.     Deep Tendon Reflexes: Reflexes are normal and symmetric.     Reflex Scores:      Brachioradialis reflexes are 2+ on the right side and  2+ on the left side.      Patellar reflexes are 2+ on the right side and 2+ on the left side. Psychiatric:        Attention and Perception: Attention normal.        Mood and Affect: Mood normal.        Speech: Speech normal.        Behavior: Behavior normal. Behavior is cooperative.        Thought Content: Thought content normal.        Judgment: Judgment normal.     Results for orders placed or performed in visit on 12/26/22  Comp Met (CMET)   Collection Time: 12/26/22  4:05 PM  Result Value Ref Range   Glucose 102 (H) 70 - 99 mg/dL   BUN 24 6 - 24 mg/dL   Creatinine, Ser 1.91 (H) 0.57 - 1.00 mg/dL   eGFR 50 (L) >47 WG/NFA/2.13   BUN/Creatinine  Ratio 19 9 - 23   Sodium 143 134 - 144 mmol/L   Potassium 4.0 3.5 - 5.2 mmol/L   Chloride 104 96 - 106 mmol/L   CO2 23 20 - 29 mmol/L   Calcium  9.9 8.7 - 10.2 mg/dL   Total Protein 7.6 6.0 - 8.5 g/dL   Albumin 4.7 3.8 - 4.9 g/dL   Globulin, Total 2.9 1.5 - 4.5 g/dL   Bilirubin Total 0.2 0.0 - 1.2 mg/dL   Alkaline Phosphatase 41 (L) 44 - 121 IU/L   AST 29 0 - 40 IU/L   ALT 23 0 - 32 IU/L  Lipid Profile   Collection Time: 12/26/22  4:05 PM  Result Value Ref Range   Cholesterol, Total 180 100 - 199 mg/dL   Triglycerides 95 0 - 149 mg/dL   HDL 86 >08 mg/dL   VLDL Cholesterol Cal 17 5 - 40 mg/dL   LDL Chol Calc (NIH) 77 0 - 99 mg/dL   Chol/HDL Ratio 2.1 0.0 - 4.4 ratio      Assessment & Plan:   Problem List Items Addressed This Visit       Cardiovascular and Mediastinum   Essential hypertension   Relevant Medications   benazepril  (LOTENSIN ) 40 MG tablet   hydrALAZINE  (APRESOLINE ) 25 MG tablet   hydrochlorothiazide  (HYDRODIURIL ) 25 MG tablet   rosuvastatin  (CRESTOR ) 20 MG tablet     Other   Mixed hyperlipidemia   Chronic.  Continue with Rosuvastatin  20mg  daily.  Refills sent today.  Labs ordered today. Will make recommendations based on lab results.      Relevant Medications   benazepril  (LOTENSIN ) 40 MG tablet   hydrALAZINE  (APRESOLINE ) 25 MG tablet   hydrochlorothiazide  (HYDRODIURIL ) 25 MG tablet   rosuvastatin  (CRESTOR ) 20 MG tablet   Other Relevant Orders   Lipid panel   Other Visit Diagnoses       Annual physical exam    -  Primary   Health maintenance reviewed during visit today.  Labs ordered.  Vaccines reviewed.  Mammogram and PAP up to date. Cologuard ordered.   Relevant Orders   CBC with Differential/Platelet   Comprehensive metabolic panel with GFR   Lipid panel   TSH     Screening for colon cancer       Relevant Orders   Cologuard        Follow up plan: Return in about 6 months (around 12/25/2023) for HTN, HLD, DM2 FU.   LABORATORY TESTING:   - Pap smear: up to date  IMMUNIZATIONS:   -  Tdap: Tetanus vaccination status reviewed: refused - Influenza: Refused - Pneumovax: Not applicable - Prevnar: Not applicable - HPV: Not applicable - Zostavax vaccine: Discussed at visit today  SCREENING: -Mammogram: up to date - Colonoscopy: Cologuard ordered today.  - Bone Density: Not applicable  -Hearing Test: Not applicable  -Spirometry: Not applicable   PATIENT COUNSELING:   Advised to take 1 mg of folate supplement per day if capable of pregnancy.   Sexuality: Discussed sexually transmitted diseases, partner selection, use of condoms, avoidance of unintended pregnancy  and contraceptive alternatives.   Advised to avoid cigarette smoking.  I discussed with the patient that most people either abstain from alcohol or drink within safe limits (<=14/week and <=4 drinks/occasion for males, <=7/weeks and <= 3 drinks/occasion for females) and that the risk for alcohol disorders and other health effects rises proportionally with the number of drinks per week and how often a drinker exceeds daily limits.  Discussed cessation/primary prevention of drug use and availability of treatment for abuse.   Diet: Encouraged to adjust caloric intake to maintain  or achieve ideal body weight, to reduce intake of dietary saturated fat and total fat, to limit sodium intake by avoiding high sodium foods and not adding table salt, and to maintain adequate dietary potassium and calcium  preferably from fresh fruits, vegetables, and low-fat dairy products.    stressed the importance of regular exercise  Injury prevention: Discussed safety belts, safety helmets, smoke detector, smoking near bedding or upholstery.   Dental health: Discussed importance of regular tooth brushing, flossing, and dental visits.    NEXT PREVENTATIVE PHYSICAL DUE IN 1 YEAR. Return in about 6 months (around 12/25/2023) for HTN, HLD, DM2 FU.

## 2023-06-26 ENCOUNTER — Ambulatory Visit: Payer: Self-pay | Admitting: Nurse Practitioner

## 2023-06-26 LAB — COMPREHENSIVE METABOLIC PANEL WITH GFR
ALT: 32 IU/L (ref 0–32)
AST: 39 IU/L (ref 0–40)
Albumin: 4.7 g/dL (ref 3.8–4.9)
Alkaline Phosphatase: 44 IU/L (ref 44–121)
BUN/Creatinine Ratio: 19 (ref 9–23)
BUN: 19 mg/dL (ref 6–24)
Bilirubin Total: 0.4 mg/dL (ref 0.0–1.2)
CO2: 22 mmol/L (ref 20–29)
Calcium: 10.1 mg/dL (ref 8.7–10.2)
Chloride: 101 mmol/L (ref 96–106)
Creatinine, Ser: 1 mg/dL (ref 0.57–1.00)
Globulin, Total: 3 g/dL (ref 1.5–4.5)
Glucose: 88 mg/dL (ref 70–99)
Potassium: 4.2 mmol/L (ref 3.5–5.2)
Sodium: 139 mmol/L (ref 134–144)
Total Protein: 7.7 g/dL (ref 6.0–8.5)
eGFR: 65 mL/min/{1.73_m2} (ref >59)

## 2023-06-26 LAB — CBC WITH DIFFERENTIAL/PLATELET
Basophils Absolute: 0 10*3/uL (ref 0.0–0.2)
Basos: 1 %
EOS (ABSOLUTE): 0.1 10*3/uL (ref 0.0–0.4)
Eos: 2 %
Hematocrit: 40.2 % (ref 34.0–46.6)
Hemoglobin: 12.6 g/dL (ref 11.1–15.9)
Immature Grans (Abs): 0 10*3/uL (ref 0.0–0.1)
Immature Granulocytes: 0 %
Lymphocytes Absolute: 1.6 10*3/uL (ref 0.7–3.1)
Lymphs: 46 %
MCH: 29.7 pg (ref 26.6–33.0)
MCHC: 31.3 g/dL — ABNORMAL LOW (ref 31.5–35.7)
MCV: 95 fL (ref 79–97)
Monocytes Absolute: 0.4 10*3/uL (ref 0.1–0.9)
Monocytes: 13 %
Neutrophils Absolute: 1.4 10*3/uL (ref 1.4–7.0)
Neutrophils: 45 %
Platelets: 297 10*3/uL (ref 150–450)
RBC: 4.24 x10E6/uL (ref 3.77–5.28)
RDW: 14.1 % (ref 11.7–15.4)
WBC: 3.5 10*3/uL (ref 3.4–10.8)

## 2023-06-26 LAB — LIPID PANEL
Chol/HDL Ratio: 2 ratio (ref 0.0–4.4)
Cholesterol, Total: 193 mg/dL (ref 100–199)
HDL: 97 mg/dL (ref >39)
LDL Chol Calc (NIH): 84 mg/dL (ref 0–99)
Triglycerides: 66 mg/dL (ref 0–149)
VLDL Cholesterol Cal: 12 mg/dL (ref 5–40)

## 2023-06-26 LAB — TSH: TSH: 1.3 u[IU]/mL (ref 0.450–4.500)

## 2023-10-07 ENCOUNTER — Encounter: Payer: Self-pay | Admitting: Nurse Practitioner

## 2023-12-25 ENCOUNTER — Ambulatory Visit: Admitting: Nurse Practitioner

## 2023-12-29 ENCOUNTER — Encounter: Payer: Self-pay | Admitting: Nurse Practitioner

## 2023-12-29 ENCOUNTER — Ambulatory Visit: Admitting: Nurse Practitioner

## 2023-12-29 VITALS — BP 132/84 | HR 80 | Temp 98.2°F | Ht 65.0 in | Wt 177.2 lb

## 2023-12-29 DIAGNOSIS — E782 Mixed hyperlipidemia: Secondary | ICD-10-CM | POA: Diagnosis not present

## 2023-12-29 DIAGNOSIS — I10 Essential (primary) hypertension: Secondary | ICD-10-CM | POA: Diagnosis not present

## 2023-12-29 MED ORDER — ROSUVASTATIN CALCIUM 20 MG PO TABS: 20.0000 mg | ORAL_TABLET | Freq: Every day | ORAL | 1 refills | Status: AC

## 2023-12-29 MED ORDER — HYDROCHLOROTHIAZIDE 25 MG PO TABS: 25.0000 mg | ORAL_TABLET | Freq: Every day | ORAL | 1 refills | Status: AC

## 2023-12-29 MED ORDER — BENAZEPRIL HCL 40 MG PO TABS: 40.0000 mg | ORAL_TABLET | Freq: Every day | ORAL | 1 refills | Status: AC

## 2023-12-29 MED ORDER — HYDRALAZINE HCL 25 MG PO TABS: 25.0000 mg | ORAL_TABLET | Freq: Three times a day (TID) | ORAL | 1 refills | Status: AC

## 2023-12-29 NOTE — Progress Notes (Signed)
 BP 132/84 Comment: home blood pressure reading  Pulse 80   Temp 98.2 F (36.8 C) (Oral)   Ht 5' 5 (1.651 m)   Wt 177 lb 3.2 oz (80.4 kg)   LMP 11/16/2017   SpO2 99%   BMI 29.49 kg/m    Subjective:    Patient ID: Nicole Cortez, female    DOB: 1965-07-02, 58 y.o.   MRN: [id number]  HPI: Nicole Cortez is a 58 y.o. female  Chief Complaint  Patient presents with   office visit    6 month, F/u   HYPERTENSION / HYPERLIPIDEMIA Satisfied with current treatment? yes Duration of hypertension: years BP monitoring frequency: daily BP range: 130/80 BP medication side effects: no Past BP meds: Hydralazine , benazepril , and HCTZ Duration of hyperlipidemia: years Cholesterol medication side effects: no Cholesterol supplements: none Past cholesterol medications: rosuvastatin  (crestor ) Medication compliance: excellent compliance Aspirin: no Recent stressors: no Recurrent headaches: no Visual changes: no Palpitations: no Dyspnea: no Chest pain: no Lower extremity edema: no Dizzy/lightheaded: no   Relevant past medical, surgical, family and social history reviewed and updated as indicated. Interim medical history since our last visit reviewed. Allergies and medications reviewed and updated.  Review of Systems  Eyes:  Negative for visual disturbance.  Respiratory:  Negative for cough, chest tightness and shortness of breath.   Cardiovascular:  Negative for chest pain, palpitations and leg swelling.  Neurological:  Negative for dizziness and headaches.    Per HPI unless specifically indicated above     Objective:    BP 132/84 Comment: home blood pressure reading  Pulse 80   Temp 98.2 F (36.8 C) (Oral)   Ht 5' 5 (1.651 m)   Wt 177 lb 3.2 oz (80.4 kg)   LMP 11/16/2017   SpO2 99%   BMI 29.49 kg/m   Wt Readings from Last 3 Encounters:  12/29/23 177 lb 3.2 oz (80.4 kg)  06/25/23 176 lb 6.4 oz (80 kg)  12/26/22 188 lb (85.3 kg)    Physical Exam Vitals and nursing  note reviewed.  Constitutional:      General: She is not in acute distress.    Appearance: Normal appearance. She is normal weight. She is not ill-appearing, toxic-appearing or diaphoretic.  HENT:     Head: Normocephalic.     Right Ear: External ear normal.     Left Ear: External ear normal.     Nose: Nose normal.     Mouth/Throat:     Mouth: Mucous membranes are moist.     Pharynx: Oropharynx is clear.  Eyes:     General:        Right eye: No discharge.        Left eye: No discharge.     Extraocular Movements: Extraocular movements intact.     Conjunctiva/sclera: Conjunctivae normal.     Pupils: Pupils are equal, round, and reactive to light.  Cardiovascular:     Rate and Rhythm: Normal rate and regular rhythm.     Heart sounds: No murmur heard. Pulmonary:     Effort: Pulmonary effort is normal. No respiratory distress.     Breath sounds: Normal breath sounds. No wheezing or rales.  Musculoskeletal:     Cervical back: Normal range of motion and neck supple.  Skin:    General: Skin is warm and dry.     Capillary Refill: Capillary refill takes less than 2 seconds.  Neurological:     General: No focal deficit present.  Mental Status: She is alert and oriented to person, place, and time. Mental status is at baseline.  Psychiatric:        Mood and Affect: Mood normal.        Behavior: Behavior normal.        Thought Content: Thought content normal.        Judgment: Judgment normal.     Results for orders placed or performed in visit on 06/25/23  CBC with Differential/Platelet   Collection Time: 06/25/23  9:32 AM  Result Value Ref Range   WBC 3.5 3.4 - 10.8 x10E3/uL   RBC 4.24 3.77 - 5.28 x10E6/uL   Hemoglobin 12.6 11.1 - 15.9 g/dL   Hematocrit 59.7 65.9 - 46.6 %   MCV 95 79 - 97 fL   MCH 29.7 26.6 - 33.0 pg   MCHC 31.3 (L) 31.5 - 35.7 g/dL   RDW 85.8 88.2 - 84.5 %   Platelets 297 150 - 450 x10E3/uL   Neutrophils 45 Not Estab. %   Lymphs 46 Not Estab. %    Monocytes 13 Not Estab. %   Eos 2 Not Estab. %   Basos 1 Not Estab. %   Neutrophils Absolute 1.4 1.4 - 7.0 x10E3/uL   Lymphocytes Absolute 1.6 0.7 - 3.1 x10E3/uL   Monocytes Absolute 0.4 0.1 - 0.9 x10E3/uL   EOS (ABSOLUTE) 0.1 0.0 - 0.4 x10E3/uL   Basophils Absolute 0.0 0.0 - 0.2 x10E3/uL   Immature Granulocytes 0 Not Estab. %   Immature Grans (Abs) 0.0 0.0 - 0.1 x10E3/uL  Comprehensive metabolic panel with GFR   Collection Time: 06/25/23  9:32 AM  Result Value Ref Range   Glucose 88 70 - 99 mg/dL   BUN 19 6 - 24 mg/dL   Creatinine, Ser 8.99 0.57 - 1.00 mg/dL   eGFR 65 >40 fO/fpw/8.26   BUN/Creatinine Ratio 19 9 - 23   Sodium 139 134 - 144 mmol/L   Potassium 4.2 3.5 - 5.2 mmol/L   Chloride 101 96 - 106 mmol/L   CO2 22 20 - 29 mmol/L   Calcium  10.1 8.7 - 10.2 mg/dL   Total Protein 7.7 6.0 - 8.5 g/dL   Albumin 4.7 3.8 - 4.9 g/dL   Globulin, Total 3.0 1.5 - 4.5 g/dL   Bilirubin Total 0.4 0.0 - 1.2 mg/dL   Alkaline Phosphatase 44 44 - 121 IU/L   AST 39 0 - 40 IU/L   ALT 32 0 - 32 IU/L  Lipid panel   Collection Time: 06/25/23  9:32 AM  Result Value Ref Range   Cholesterol, Total 193 100 - 199 mg/dL   Triglycerides 66 0 - 149 mg/dL   HDL 97 >60 mg/dL   VLDL Cholesterol Cal 12 5 - 40 mg/dL   LDL Chol Calc (NIH) 84 0 - 99 mg/dL   Chol/HDL Ratio 2.0 0.0 - 4.4 ratio  TSH   Collection Time: 06/25/23  9:32 AM  Result Value Ref Range   TSH 1.300 0.450 - 4.500 uIU/mL      Assessment & Plan:   Problem List Items Addressed This Visit       Cardiovascular and Mediastinum   Essential hypertension   Chronic.  Controlled.  Continue with current medication regimen of Hydralazine , HCTZ, and Benazepril .  Elevated at visit today but controlled when she checks it at home.  Refills sent today.  Labs ordered today.  Return to clinic in 6 months for reevaluation.  Call sooner if concerns arise.  Relevant Medications   benazepril  (LOTENSIN ) 40 MG tablet   hydrALAZINE  (APRESOLINE ) 25  MG tablet   hydrochlorothiazide  (HYDRODIURIL ) 25 MG tablet   rosuvastatin  (CRESTOR ) 20 MG tablet   Other Relevant Orders   Comprehensive metabolic panel with GFR     Other   Mixed hyperlipidemia - Primary   Chronic.  Continue with Rosuvastatin  20mg  daily.  Refills sent today.  Labs ordered today. Will make recommendations based on lab results.      Relevant Medications   benazepril  (LOTENSIN ) 40 MG tablet   hydrALAZINE  (APRESOLINE ) 25 MG tablet   hydrochlorothiazide  (HYDRODIURIL ) 25 MG tablet   rosuvastatin  (CRESTOR ) 20 MG tablet   Other Relevant Orders   CBC With Diff/Platelet     Follow up plan: Return in about 6 months (around 06/28/2024) for Physical and Fasting labs.

## 2023-12-29 NOTE — Assessment & Plan Note (Signed)
 Chronic.  Controlled.  Continue with current medication regimen of Hydralazine , HCTZ, and Benazepril .  Elevated at visit today but controlled when she checks it at home.  Refills sent today.  Labs ordered today.  Return to clinic in 6 months for reevaluation.  Call sooner if concerns arise.

## 2023-12-29 NOTE — Assessment & Plan Note (Signed)
 Chronic.  Continue with Rosuvastatin  20mg  daily.  Refills sent today.  Labs ordered today. Will make recommendations based on lab results.

## 2023-12-30 ENCOUNTER — Ambulatory Visit: Payer: Self-pay | Admitting: Nurse Practitioner

## 2023-12-30 LAB — CBC WITH DIFF/PLATELET
Basophils Absolute: 0 x10E3/uL (ref 0.0–0.2)
Basos: 1 %
EOS (ABSOLUTE): 0.1 x10E3/uL (ref 0.0–0.4)
Eos: 2 %
Hematocrit: 39.1 % (ref 34.0–46.6)
Hemoglobin: 12.5 g/dL (ref 11.1–15.9)
Immature Grans (Abs): 0 x10E3/uL (ref 0.0–0.1)
Immature Granulocytes: 0 %
Lymphocytes Absolute: 1.7 x10E3/uL (ref 0.7–3.1)
Lymphs: 42 %
MCH: 30 pg (ref 26.6–33.0)
MCHC: 32 g/dL (ref 31.5–35.7)
MCV: 94 fL (ref 79–97)
Monocytes Absolute: 0.4 x10E3/uL (ref 0.1–0.9)
Monocytes: 11 %
Neutrophils Absolute: 1.8 x10E3/uL (ref 1.4–7.0)
Neutrophils: 44 %
Platelets: 337 x10E3/uL (ref 150–450)
RBC: 4.16 x10E6/uL (ref 3.77–5.28)
RDW: 12.9 % (ref 11.7–15.4)
WBC: 4.1 x10E3/uL (ref 3.4–10.8)

## 2023-12-30 LAB — COMPREHENSIVE METABOLIC PANEL WITH GFR
ALT: 22 IU/L (ref 0–32)
AST: 29 IU/L (ref 0–40)
Albumin: 4.9 g/dL (ref 3.8–4.9)
Alkaline Phosphatase: 43 IU/L — ABNORMAL LOW (ref 49–135)
BUN/Creatinine Ratio: 21 (ref 12–28)
BUN: 25 mg/dL (ref 8–27)
Bilirubin Total: 0.3 mg/dL (ref 0.0–1.2)
CO2: 24 mmol/L (ref 20–29)
Calcium: 10.1 mg/dL (ref 8.7–10.3)
Chloride: 101 mmol/L (ref 96–106)
Creatinine, Ser: 1.21 mg/dL — ABNORMAL HIGH (ref 0.57–1.00)
Globulin, Total: 2.4 g/dL (ref 1.5–4.5)
Glucose: 85 mg/dL (ref 70–99)
Potassium: 4.3 mmol/L (ref 3.5–5.2)
Sodium: 140 mmol/L (ref 134–144)
Total Protein: 7.3 g/dL (ref 6.0–8.5)
eGFR: 51 mL/min/1.73 — ABNORMAL LOW (ref >59)

## 2024-06-28 ENCOUNTER — Encounter: Admitting: Nurse Practitioner
